# Patient Record
Sex: Female | Born: 1955 | Race: Black or African American | Hispanic: No | Marital: Married | Smoking: Former smoker
Health system: Southern US, Community
[De-identification: ages and names within clinical notes are randomized; demographics above are authoritative.]

## PROBLEM LIST (undated history)

## (undated) DIAGNOSIS — I10 Essential (primary) hypertension: Secondary | ICD-10-CM

## (undated) DIAGNOSIS — E079 Disorder of thyroid, unspecified: Secondary | ICD-10-CM

## (undated) DIAGNOSIS — G4730 Sleep apnea, unspecified: Secondary | ICD-10-CM

## (undated) DIAGNOSIS — C801 Malignant (primary) neoplasm, unspecified: Secondary | ICD-10-CM

## (undated) HISTORY — DX: Disorder of thyroid, unspecified: E07.9

## (undated) HISTORY — PX: THYROID SURGERY: SHX805

## (undated) HISTORY — PX: ABDOMINAL HYSTERECTOMY: SHX81

## (undated) HISTORY — DX: Sleep apnea, unspecified: G47.30

## (undated) HISTORY — PX: BLADDER SURGERY: SHX569

## (undated) HISTORY — PX: COLON SURGERY: SHX602

---

## 2006-07-31 DIAGNOSIS — C801 Malignant (primary) neoplasm, unspecified: Secondary | ICD-10-CM

## 2006-07-31 HISTORY — DX: Malignant (primary) neoplasm, unspecified: C80.1

## 2006-11-01 ENCOUNTER — Ambulatory Visit (HOSPITAL_COMMUNITY): Admission: RE | Admit: 2006-11-01 | Discharge: 2006-11-01 | Payer: Self-pay | Admitting: Surgery

## 2006-12-04 ENCOUNTER — Ambulatory Visit: Payer: Self-pay | Admitting: Oncology

## 2006-12-20 LAB — COMPREHENSIVE METABOLIC PANEL
ALT: 12 U/L (ref 0–35)
AST: 14 U/L (ref 0–37)
Albumin: 4.5 g/dL (ref 3.5–5.2)
Alkaline Phosphatase: 63 U/L (ref 39–117)
BUN: 14 mg/dL (ref 6–23)
CO2: 26 mEq/L (ref 19–32)
Chloride: 105 mEq/L (ref 96–112)
Creatinine, Ser: 0.82 mg/dL (ref 0.40–1.20)
Potassium: 4.1 mEq/L (ref 3.5–5.3)
Total Bilirubin: 0.2 mg/dL — ABNORMAL LOW (ref 0.3–1.2)
Total Protein: 7.3 g/dL (ref 6.0–8.3)

## 2006-12-20 LAB — CBC WITH DIFFERENTIAL/PLATELET
Basophils Absolute: 0 10*3/uL (ref 0.0–0.1)
EOS%: 4.4 % (ref 0.0–7.0)
Eosinophils Absolute: 0.2 10*3/uL (ref 0.0–0.5)
HGB: 10.6 g/dL — ABNORMAL LOW (ref 11.6–15.9)
MCH: 26.8 pg (ref 26.0–34.0)
MCHC: 32.7 g/dL (ref 32.0–36.0)
MCV: 81.9 fL (ref 81.0–101.0)
MONO#: 0.4 10*3/uL (ref 0.1–0.9)
MONO%: 7.5 % (ref 0.0–13.0)
NEUT%: 55.7 % (ref 39.6–76.8)
Platelets: 249 10*3/uL (ref 145–400)
RBC: 3.95 10*6/uL (ref 3.70–5.32)
RDW: 14.7 % — ABNORMAL HIGH (ref 11.3–14.5)
lymph#: 1.7 10*3/uL (ref 0.9–3.3)

## 2006-12-26 ENCOUNTER — Ambulatory Visit (HOSPITAL_COMMUNITY): Admission: RE | Admit: 2006-12-26 | Discharge: 2006-12-26 | Payer: Self-pay | Admitting: Oncology

## 2007-01-15 LAB — CBC WITH DIFFERENTIAL/PLATELET
EOS%: 3 % (ref 0.0–7.0)
LYMPH%: 38 % (ref 14.0–48.0)
MONO#: 0.6 10*3/uL (ref 0.1–0.9)
Platelets: 204 10*3/uL (ref 145–400)
RBC: 4.34 10*6/uL (ref 3.70–5.32)
RDW: 15.5 % — ABNORMAL HIGH (ref 11.3–14.5)

## 2007-01-19 ENCOUNTER — Ambulatory Visit: Payer: Self-pay | Admitting: Oncology

## 2007-01-30 LAB — CBC WITH DIFFERENTIAL/PLATELET
BASO%: 1.2 % (ref 0.0–2.0)
Basophils Absolute: 0.1 10*3/uL (ref 0.0–0.1)
HCT: 38.3 % (ref 34.8–46.6)
LYMPH%: 34.8 % (ref 14.0–48.0)
MCH: 28.2 pg (ref 26.0–34.0)
MCV: 84.3 fL (ref 81.0–101.0)
NEUT#: 2.9 10*3/uL (ref 1.5–6.5)
NEUT%: 53 % (ref 39.6–76.8)
Platelets: 150 10*3/uL (ref 145–400)
WBC: 5.4 10*3/uL (ref 3.9–10.0)

## 2007-02-14 LAB — CBC WITH DIFFERENTIAL/PLATELET
BASO%: 2 % (ref 0.0–2.0)
EOS%: 1.3 % (ref 0.0–7.0)
HCT: 41.2 % (ref 34.8–46.6)
HGB: 13.2 g/dL (ref 11.6–15.9)
MONO#: 0.6 10*3/uL (ref 0.1–0.9)
Platelets: 113 10*3/uL — ABNORMAL LOW (ref 145–400)
RDW: 18.9 % — ABNORMAL HIGH (ref 11.3–14.5)
WBC: 4.8 10*3/uL (ref 3.9–10.0)
lymph#: 2 10*3/uL (ref 0.9–3.3)

## 2007-02-14 LAB — COMPREHENSIVE METABOLIC PANEL
ALT: 17 U/L (ref 0–35)
CO2: 24 mEq/L (ref 19–32)
Chloride: 106 mEq/L (ref 96–112)
Creatinine, Ser: 0.75 mg/dL (ref 0.40–1.20)
Sodium: 140 mEq/L (ref 135–145)
Total Protein: 7 g/dL (ref 6.0–8.3)

## 2007-02-26 LAB — CBC WITH DIFFERENTIAL/PLATELET
Basophils Absolute: 0.1 10*3/uL (ref 0.0–0.1)
EOS%: 1.2 % (ref 0.0–7.0)
Eosinophils Absolute: 0.1 10*3/uL (ref 0.0–0.5)
HCT: 41.3 % (ref 34.8–46.6)
LYMPH%: 37.1 % (ref 14.0–48.0)
MCH: 28.7 pg (ref 26.0–34.0)
MONO#: 0.7 10*3/uL (ref 0.1–0.9)
NEUT#: 2.4 10*3/uL (ref 1.5–6.5)
NEUT%: 46.7 % (ref 39.6–76.8)
Platelets: 108 10*3/uL — ABNORMAL LOW (ref 145–400)
RBC: 4.65 10*6/uL (ref 3.70–5.32)
RDW: 19.9 % — ABNORMAL HIGH (ref 11.3–14.5)

## 2007-02-28 ENCOUNTER — Ambulatory Visit: Payer: Self-pay | Admitting: Oncology

## 2007-03-14 LAB — CBC WITH DIFFERENTIAL/PLATELET
Basophils Absolute: 0.1 10*3/uL (ref 0.0–0.1)
LYMPH%: 41 % (ref 14.0–48.0)
MCH: 29.2 pg (ref 26.0–34.0)
MCHC: 33 g/dL (ref 32.0–36.0)
NEUT#: 2 10*3/uL (ref 1.5–6.5)
NEUT%: 44.7 % (ref 39.6–76.8)
RBC: 4.41 10*6/uL (ref 3.70–5.32)
lymph#: 1.8 10*3/uL (ref 0.9–3.3)

## 2007-03-28 LAB — CBC WITH DIFFERENTIAL/PLATELET
EOS%: 1 % (ref 0.0–7.0)
Eosinophils Absolute: 0 10*3/uL (ref 0.0–0.5)
LYMPH%: 41.6 % (ref 14.0–48.0)
MCH: 29.8 pg (ref 26.0–34.0)
MCHC: 32.5 g/dL (ref 32.0–36.0)
MCV: 91.8 fL (ref 81.0–101.0)
MONO%: 11.7 % (ref 0.0–13.0)
Platelets: 70 10*3/uL — ABNORMAL LOW (ref 145–400)
RBC: 4.43 10*6/uL (ref 3.70–5.32)

## 2007-03-28 LAB — COMPREHENSIVE METABOLIC PANEL
AST: 26 U/L (ref 0–37)
Albumin: 4.2 g/dL (ref 3.5–5.2)
Chloride: 101 mEq/L (ref 96–112)
Glucose, Bld: 260 mg/dL — ABNORMAL HIGH (ref 70–99)
Total Bilirubin: 0.3 mg/dL (ref 0.3–1.2)
Total Protein: 6.8 g/dL (ref 6.0–8.3)

## 2007-04-09 ENCOUNTER — Ambulatory Visit: Payer: Self-pay | Admitting: Oncology

## 2007-04-10 LAB — CBC WITH DIFFERENTIAL/PLATELET
BASO%: 1.8 % (ref 0.0–2.0)
LYMPH%: 37.6 % (ref 14.0–48.0)
MCHC: 32.5 g/dL (ref 32.0–36.0)
MONO#: 0.8 10*3/uL (ref 0.1–0.9)
MONO%: 13.5 % — ABNORMAL HIGH (ref 0.0–13.0)
Platelets: 109 10*3/uL — ABNORMAL LOW (ref 145–400)
RBC: 4.43 10*6/uL (ref 3.70–5.32)
RDW: 18.4 % — ABNORMAL HIGH (ref 11.3–14.5)
WBC: 5.7 10*3/uL (ref 3.9–10.0)

## 2007-04-25 LAB — COMPREHENSIVE METABOLIC PANEL
ALT: 18 U/L (ref 0–35)
Alkaline Phosphatase: 69 U/L (ref 39–117)
CO2: 25 mEq/L (ref 19–32)
Sodium: 140 mEq/L (ref 135–145)
Total Bilirubin: 0.3 mg/dL (ref 0.3–1.2)
Total Protein: 7.2 g/dL (ref 6.0–8.3)

## 2007-04-25 LAB — CBC WITH DIFFERENTIAL/PLATELET
BASO%: 1 % (ref 0.0–2.0)
LYMPH%: 31.6 % (ref 14.0–48.0)
MCHC: 34.3 g/dL (ref 32.0–36.0)
MCV: 95.5 fL (ref 81.0–101.0)
MONO%: 10.5 % (ref 0.0–13.0)
Platelets: 132 10*3/uL — ABNORMAL LOW (ref 145–400)
RBC: 3.97 10*6/uL (ref 3.70–5.32)

## 2007-05-09 LAB — CBC WITH DIFFERENTIAL/PLATELET
BASO%: 2 % (ref 0.0–2.0)
LYMPH%: 35.5 % (ref 14.0–48.0)
MCHC: 35.1 g/dL (ref 32.0–36.0)
MONO#: 0.6 10*3/uL (ref 0.1–0.9)
RBC: 3.71 10*6/uL (ref 3.70–5.32)
WBC: 3.9 10*3/uL (ref 3.9–10.0)
lymph#: 1.4 10*3/uL (ref 0.9–3.3)

## 2007-05-20 LAB — CBC WITH DIFFERENTIAL/PLATELET
BASO%: 1.3 % (ref 0.0–2.0)
HCT: 39.6 % (ref 34.8–46.6)
MCHC: 33.4 g/dL (ref 32.0–36.0)
MCV: 101 fL (ref 81.0–101.0)
MONO#: 0.7 10*3/uL (ref 0.1–0.9)
NEUT%: 45.3 % (ref 39.6–76.8)
RBC: 3.92 10*6/uL (ref 3.70–5.32)
RDW: 14.4 % (ref 11.3–14.5)
WBC: 5.3 10*3/uL (ref 3.9–10.0)
lymph#: 2 10*3/uL (ref 0.9–3.3)

## 2007-05-21 ENCOUNTER — Ambulatory Visit: Payer: Self-pay | Admitting: Oncology

## 2007-05-23 LAB — COMPREHENSIVE METABOLIC PANEL
Alkaline Phosphatase: 67 U/L (ref 39–117)
CO2: 23 mEq/L (ref 19–32)
Creatinine, Ser: 0.93 mg/dL (ref 0.40–1.20)
Glucose, Bld: 172 mg/dL — ABNORMAL HIGH (ref 70–99)
Sodium: 141 mEq/L (ref 135–145)
Total Bilirubin: 0.4 mg/dL (ref 0.3–1.2)
Total Protein: 7.3 g/dL (ref 6.0–8.3)

## 2007-06-06 LAB — CBC WITH DIFFERENTIAL/PLATELET
Basophils Absolute: 0 10*3/uL (ref 0.0–0.1)
Eosinophils Absolute: 0.1 10*3/uL (ref 0.0–0.5)
HCT: 39.2 % (ref 34.8–46.6)
HGB: 13.7 g/dL (ref 11.6–15.9)
LYMPH%: 28.2 % (ref 14.0–48.0)
MCV: 97.2 fL (ref 81.0–101.0)
MONO#: 0.4 10*3/uL (ref 0.1–0.9)
MONO%: 7.8 % (ref 0.0–13.0)
NEUT#: 2.9 10*3/uL (ref 1.5–6.5)
NEUT%: 61.3 % (ref 39.6–76.8)
Platelets: 110 10*3/uL — ABNORMAL LOW (ref 145–400)
WBC: 4.7 10*3/uL (ref 3.9–10.0)

## 2007-07-02 ENCOUNTER — Ambulatory Visit: Payer: Self-pay | Admitting: Oncology

## 2007-07-02 LAB — CBC WITH DIFFERENTIAL/PLATELET
BASO%: 1.5 % (ref 0.0–2.0)
HCT: 43.8 % (ref 34.8–46.6)
LYMPH%: 42.8 % (ref 14.0–48.0)
MCH: 32.8 pg (ref 26.0–34.0)
MCHC: 34.3 g/dL (ref 32.0–36.0)
MCV: 95.8 fL (ref 81.0–101.0)
MONO#: 0.7 10*3/uL (ref 0.1–0.9)
MONO%: 13.6 % — ABNORMAL HIGH (ref 0.0–13.0)
NEUT%: 40.1 % (ref 39.6–76.8)
Platelets: 147 10*3/uL (ref 145–400)
RBC: 4.57 10*6/uL (ref 3.70–5.32)
WBC: 5.5 10*3/uL (ref 3.9–10.0)

## 2007-07-02 LAB — COMPREHENSIVE METABOLIC PANEL
ALT: 19 U/L (ref 0–35)
Alkaline Phosphatase: 80 U/L (ref 39–117)
CO2: 24 mEq/L (ref 19–32)
Creatinine, Ser: 0.82 mg/dL (ref 0.40–1.20)
Glucose, Bld: 128 mg/dL — ABNORMAL HIGH (ref 70–99)
Total Bilirubin: 0.4 mg/dL (ref 0.3–1.2)

## 2007-07-02 LAB — CEA: CEA: 0.8 ng/mL (ref 0.0–5.0)

## 2007-08-09 LAB — CBC WITH DIFFERENTIAL/PLATELET
Basophils Absolute: 0 10*3/uL (ref 0.0–0.1)
Eosinophils Absolute: 0.1 10*3/uL (ref 0.0–0.5)
HGB: 13.6 g/dL (ref 11.6–15.9)
LYMPH%: 37.9 % (ref 14.0–48.0)
MCV: 96.1 fL (ref 81.0–101.0)
MONO%: 9.5 % (ref 0.0–13.0)
NEUT#: 2.6 10*3/uL (ref 1.5–6.5)
Platelets: 221 10*3/uL (ref 145–400)

## 2007-08-09 LAB — COMPREHENSIVE METABOLIC PANEL
Alkaline Phosphatase: 80 U/L (ref 39–117)
BUN: 12 mg/dL (ref 6–23)
Glucose, Bld: 166 mg/dL — ABNORMAL HIGH (ref 70–99)
Total Bilirubin: 0.5 mg/dL (ref 0.3–1.2)

## 2007-10-04 ENCOUNTER — Ambulatory Visit: Payer: Self-pay | Admitting: Oncology

## 2007-11-26 ENCOUNTER — Ambulatory Visit: Payer: Self-pay | Admitting: Oncology

## 2007-11-29 LAB — CBC WITH DIFFERENTIAL/PLATELET
Eosinophils Absolute: 0.2 10*3/uL (ref 0.0–0.5)
HCT: 40.5 % (ref 34.8–46.6)
HGB: 13.1 g/dL (ref 11.6–15.9)
MCH: 30 pg (ref 26.0–34.0)
MCV: 92.5 fL (ref 81.0–101.0)
NEUT#: 2.3 10*3/uL (ref 1.5–6.5)
RBC: 4.38 10*6/uL (ref 3.70–5.32)
RDW: 12.9 % (ref 11.3–14.5)
lymph#: 1.7 10*3/uL (ref 0.9–3.3)

## 2007-11-29 LAB — COMPREHENSIVE METABOLIC PANEL
ALT: 16 U/L (ref 0–35)
AST: 18 U/L (ref 0–37)
Albumin: 4.3 g/dL (ref 3.5–5.2)
Alkaline Phosphatase: 82 U/L (ref 39–117)
BUN: 13 mg/dL (ref 6–23)
CO2: 24 mEq/L (ref 19–32)
Calcium: 8.9 mg/dL (ref 8.4–10.5)
Chloride: 106 mEq/L (ref 96–112)
Creatinine, Ser: 0.81 mg/dL (ref 0.40–1.20)
Glucose, Bld: 264 mg/dL — ABNORMAL HIGH (ref 70–99)
Potassium: 3.9 mEq/L (ref 3.5–5.3)
Sodium: 141 mEq/L (ref 135–145)
Total Bilirubin: 0.3 mg/dL (ref 0.3–1.2)
Total Protein: 7 g/dL (ref 6.0–8.3)

## 2008-03-10 ENCOUNTER — Ambulatory Visit: Payer: Self-pay | Admitting: Oncology

## 2009-05-17 ENCOUNTER — Emergency Department (HOSPITAL_COMMUNITY): Admission: EM | Admit: 2009-05-17 | Discharge: 2009-05-18 | Payer: Self-pay | Admitting: Emergency Medicine

## 2010-11-03 LAB — DIFFERENTIAL
Basophils Relative: 1 % (ref 0–1)
Eosinophils Absolute: 0.2 10*3/uL (ref 0.0–0.7)
Lymphocytes Relative: 44 % (ref 12–46)
Monocytes Absolute: 0.5 10*3/uL (ref 0.1–1.0)
Monocytes Relative: 8 % (ref 3–12)
Neutro Abs: 2.7 10*3/uL (ref 1.7–7.7)

## 2010-11-03 LAB — BASIC METABOLIC PANEL
CO2: 29 mEq/L (ref 19–32)
Calcium: 9.9 mg/dL (ref 8.4–10.5)
Chloride: 98 mEq/L (ref 96–112)
Creatinine, Ser: 1 mg/dL (ref 0.4–1.2)
GFR calc Af Amer: >60 mL/min (ref >60)
GFR calc non Af Amer: 58 mL/min — ABNORMAL LOW (ref >60)

## 2010-11-03 LAB — POCT CARDIAC MARKERS
CKMB, poc: 1.8 ng/mL (ref 1.0–8.0)
CKMB, poc: <1 ng/mL — ABNORMAL LOW (ref 1.0–8.0)
Myoglobin, poc: 66.9 ng/mL (ref 12–200)
Myoglobin, poc: 81.3 ng/mL (ref 12–200)
Troponin i, poc: <0.05 ng/mL (ref 0.00–0.09)
Troponin i, poc: <0.05 ng/mL (ref 0.00–0.09)

## 2010-11-03 LAB — GLUCOSE, CAPILLARY

## 2010-11-03 LAB — CBC
HCT: 39.6 % (ref 36.0–46.0)
Platelets: 181 10*3/uL (ref 150–400)
RBC: 4.18 MIL/uL (ref 3.87–5.11)
RDW: 12.9 % (ref 11.5–15.5)
WBC: 6 10*3/uL (ref 4.0–10.5)

## 2010-12-19 ENCOUNTER — Emergency Department (HOSPITAL_COMMUNITY)
Admission: EM | Admit: 2010-12-19 | Discharge: 2010-12-19 | Discharge status: Against Medical Advice | Payer: Self-pay | Attending: Emergency Medicine | Admitting: Emergency Medicine

## 2010-12-19 DIAGNOSIS — R209 Unspecified disturbances of skin sensation: Secondary | ICD-10-CM | POA: Insufficient documentation

## 2010-12-19 DIAGNOSIS — M542 Cervicalgia: Secondary | ICD-10-CM | POA: Insufficient documentation

## 2010-12-19 DIAGNOSIS — M79609 Pain in unspecified limb: Secondary | ICD-10-CM | POA: Insufficient documentation

## 2010-12-19 DIAGNOSIS — M25519 Pain in unspecified shoulder: Secondary | ICD-10-CM | POA: Insufficient documentation

## 2011-11-17 ENCOUNTER — Emergency Department (HOSPITAL_COMMUNITY): Payer: Non-veteran care

## 2011-11-17 ENCOUNTER — Emergency Department (HOSPITAL_COMMUNITY)
Admission: EM | Admit: 2011-11-17 | Discharge: 2011-11-17 | Disposition: A | Discharge status: Home / Self Care | Payer: Non-veteran care | Attending: Emergency Medicine | Admitting: Emergency Medicine

## 2011-11-17 ENCOUNTER — Encounter (HOSPITAL_COMMUNITY): Payer: Self-pay | Admitting: Emergency Medicine

## 2011-11-17 DIAGNOSIS — R079 Chest pain, unspecified: Secondary | ICD-10-CM | POA: Insufficient documentation

## 2011-11-17 DIAGNOSIS — M549 Dorsalgia, unspecified: Secondary | ICD-10-CM

## 2011-11-17 DIAGNOSIS — M62838 Other muscle spasm: Secondary | ICD-10-CM | POA: Insufficient documentation

## 2011-11-17 HISTORY — DX: Malignant (primary) neoplasm, unspecified: C80.1

## 2011-11-17 HISTORY — DX: Essential (primary) hypertension: I10

## 2011-11-17 LAB — CBC
HCT: 40.9 % (ref 36.0–46.0)
Hemoglobin: 12.9 g/dL (ref 12.0–15.0)
MCHC: 31.5 g/dL (ref 30.0–36.0)
MCV: 94.2 fL (ref 78.0–100.0)

## 2011-11-17 LAB — POCT I-STAT, CHEM 8
Creatinine, Ser: 1 mg/dL (ref 0.50–1.10)
HCT: 41 % (ref 36.0–46.0)
Hemoglobin: 13.9 g/dL (ref 12.0–15.0)
Potassium: 4 mEq/L (ref 3.5–5.1)
Sodium: 140 mEq/L (ref 135–145)

## 2011-11-17 LAB — DIFFERENTIAL
Basophils Relative: 1 % (ref 0–1)
Monocytes Absolute: 0.5 10*3/uL (ref 0.1–1.0)
Monocytes Relative: 9 % (ref 3–12)
Neutro Abs: 2.4 10*3/uL (ref 1.7–7.7)

## 2011-11-17 LAB — PROTIME-INR: INR: 1.01 (ref 0.00–1.49)

## 2011-11-17 LAB — POCT I-STAT TROPONIN I

## 2011-11-17 LAB — D-DIMER, QUANTITATIVE: D-Dimer, Quant: 0.26 ug/mL-FEU (ref 0.00–0.48)

## 2011-11-17 MED ORDER — KETOROLAC TROMETHAMINE 30 MG/ML IJ SOLN: 30.0000 mg | Freq: Once | INTRAMUSCULAR | Status: DC

## 2011-11-17 MED ORDER — NAPROXEN 500 MG PO TABS: 500.0000 mg | ORAL_TABLET | Freq: Two times a day (BID) | ORAL | Status: AC

## 2011-11-17 MED ORDER — SODIUM CHLORIDE 0.9 % IV SOLN: Freq: Once | INTRAVENOUS | Status: DC

## 2011-11-17 MED ORDER — METHOCARBAMOL 500 MG PO TABS: 500.0000 mg | ORAL_TABLET | Freq: Two times a day (BID) | ORAL | Status: AC | PRN

## 2011-11-17 MED ORDER — KETOROLAC TROMETHAMINE 60 MG/2ML IM SOLN
60.0000 mg | Freq: Once | INTRAMUSCULAR | Status: AC
  Administered 2011-11-17: 60 mg via INTRAMUSCULAR
  Filled 2011-11-17: qty 2

## 2011-11-17 NOTE — Discharge Instructions (Signed)
Your caregiver has diagnosed you as having chest pain that is nonspecific for one problem. This means that after looking at you and examining you and ordering tests (such as blood work, chest x-rays and EKG), your caregiver does not believe that the problem is serious enough to need watching in the hospital. This judgment is often made after testing shows no acute heart attack and you are at low risk for sudden acute heart condition. Chest pain comes from many different causes.  Seek immediate medical attention if:   You have severe chest pain, especially if the pain is crushing or pressure-like and spreads to the arms, back, neck, or jaw, or if you have sweating, nausea, shortness of breath. This is an emergency. Don't wait to see if the pain will go away. Get medical help at once. Call 911 immediately. Do not drive herself to the hospital.   Your chest pain gets worse and does not go away with rest.   You have an attack of chest pain lasting longer than usual, despite rest and treatment with the medications your caregiver has prescribed   You awaken from sleep with chest pain or shortness of breath.   You feel faint or dizzy   You have chest pain not typical of your usual pain for which you originally saw your caregiver.  You must have a repeat evaluation within 24 hours for a recheck of your heart.  Please call your doctor this morning to schedule this appointment. If you do not have a family doctor, please see the list of doctors below.  RESOURCE GUIDE  Dental Problems  Patients with Medicaid: [facility]                     [facility] [address]                                           [address] Phone:  [phone]                                                  Phone:  [phone]  If unable to pay or uninsured, contact:  [facility] or [facility] to become qualified for the adult dental clinic.  Chronic Pain  Problems Contact [facility]  [phone] Patients need to be referred by their primary care doctor.  Insufficient Money for Medicine Contact [employer]:  call "211" or [facility] 271-5999.  No Primary Care Doctor Call [facility]  832-8000 Other agencies that provide inexpensive medical care    [facility]  [phone]    [facility]  [phone]    [facility]  [phone]    [facility] Clinic  [phone]    [facility]  [phone]    [facility]  [phone]  Psychological Services [facility]  [phone] [facility]  [phone] [facility]   800 853-5163 (emergency services 641-4993)  Substance Abuse Resources [facility]  336-882-2125 [facility] 336-784-9470 [facility] 336-285-9073 [facility] 336-845-3988 Residential & Outpatient Substance Abuse Program  800-659-3381  Abuse/Neglect [place] Child Abuse Hotline (336) 641-3795 [place] [facility] Hotline 800-378-5315 (After Hours)  Emergency Shelter [facility] (336) 271-5985  [facility] (336) 275-9566 [facility] (704) 372-4663  MRSA Hotline #:   832-7006    [place] [facility]  [facility] [place]     [employer]                          [facility] [address] [place]                       [address]      [address]  [place]                                                [place]                            [place] Phone:  [phone]                                   Phone:  [phone]                 Phone:  [phone]  [facility] Phone:  [phone]  [place] Child Abuse Hotline (336) 342-1394 (336) 342-3537 (After Hours)    

## 2011-11-17 NOTE — ED Notes (Signed)
Monitor shows NSR to sinus brady - no ectopics.  Skin warm and dry - color good.  02 started via nasal cannula at 2L/min

## 2011-11-17 NOTE — ED Provider Notes (Signed)
History     CSN: [id number]  Arrival date & time 11/17/11  0211   First MD Initiated Contact with Patient 11/17/11 [phone]      Chief Complaint  Patient presents with  . Chest Pain    (Consider location/radiation/quality/duration/timing/severity/associated sxs/prior treatment) HPI Comments: 56 year old female with a past medical history significant for hypertension and diabetes and colon cancer for which she had surgery in 2008 and finished chemotherapy. She has been told that she is in remission. Approximately 36 hours ago she developed a right upper back pain which was sharp and stinging, worse with movement and palpation and deep breathing. Over the last 24 hours this has become gradually worse, while she was in bed tonight she rolled over and felt that the pain became acutely worse. This occurred during this motion. She states that the pain is now in her chest is a sharp pain and is also reproducible with palpation over her right parasternal area. The symptoms are persistent, worse with deep breathing, not associated with cough, shortness of breath, swelling of the legs, asymmetry, travel, trauma, hormone replacement, tobacco use.  She states that her disease does run in her family however she has had EKGs but never had a stress test. She states that this pain has been more or less constant for the last day and a half.  The history is provided by the patient and medical records.    History reviewed. No pertinent past medical history.  No past surgical history on file.  No family history on file.  History  Substance Use Topics  . Smoking status: Not on file  . Smokeless tobacco: Not on file  . Alcohol Use: Not on file    OB History    Grav Para Term Preterm Abortions TAB SAB Ect Mult Living                  Review of Systems  All other systems reviewed and are negative.    Allergies  Review of patient's allergies indicates no known allergies.  Home Medications    Current Outpatient Rx  Name Route Sig Dispense Refill  . CLONIDINE HCL 0.1 MG PO TABS Oral Take 0.1 mg by mouth 2 (two) times daily. Does not know the DOSE    . LEVOTHYROXINE SODIUM 25 MCG PO TABS Oral Take by mouth daily. NOT SURE OF THE DOSAGE    . LISINOPRIL 10 MG PO TABS Oral Take 10 mg by mouth daily. Does not know the DOSE    . METFORMIN HCL 500 MG PO TABS Oral Take 500 mg by mouth 2 (two) times daily with a meal. Dose not know the DOSE      BP 153/72  Pulse 72  Temp(Src) 98.1 F (36.7 C) (Oral)  Resp 16  SpO2 98%  Physical Exam  Nursing note and vitals reviewed. Constitutional: She appears well-developed and well-nourished. No distress.  HENT:  Head: Normocephalic and atraumatic.  Mouth/Throat: Oropharynx is clear and moist. No oropharyngeal exudate.  Eyes: Conjunctivae and EOM are normal. Pupils are equal, round, and reactive to light. Right eye exhibits no discharge. Left eye exhibits no discharge. No scleral icterus.  Neck: Normal range of motion. Neck supple. No JVD present. No thyromegaly present.  Cardiovascular: Normal rate, regular rhythm, normal heart sounds and intact distal pulses.  Exam reveals no gallop and no friction rub.   No murmur heard. Pulmonary/Chest: Effort normal and breath sounds normal. No respiratory distress. She has no wheezes. She has no  rales. She exhibits tenderness ( right parasternal reproducible chest wall tenderness, patient states this is exactly the same as her pain).  Abdominal: Soft. Bowel sounds are normal. She exhibits no distension and no mass. There is no tenderness.  Musculoskeletal: Normal range of motion. She exhibits tenderness. She exhibits no edema.       Tender palpation over the right rhomboid area, worse with stretching the same muscles with extension at the right elbow and bringing the arm across the body.  Lymphadenopathy:    She has no cervical adenopathy.  Neurological: She is alert. Coordination normal.  Skin: Skin is  warm and dry. No rash noted. No erythema.  Psychiatric: She has a normal mood and affect. Her behavior is normal.    ED Course  Procedures (including critical care time)  ED ECG REPORT   Date: 11/17/2011   Rate: 70  Rhythm: normal sinus rhythm  QRS Axis: normal  Intervals: normal  ST/T Wave abnormalities: normal  Conduction Disutrbances:none  Narrative Interpretation:   Old EKG Reviewed: compared with 12/19/2010, and no changes found   Labs Reviewed - No data to display No results found.   No diagnosis found.    MDM  The heart and lungs sound normal, she does not have a tachycardia or hypoxia or any evidence of DVT on her lower extremity exam. Her risk factors for pulmonary embolism include history of colon cancer though this is not active. Her EKG is normal, there is no signs of ischemia, tachycardia or right heart strain. We'll pursue laboratory workup, chest x-ray, Toradol, reevaluate.  ED Course:  The patient has improved after medications, vital signs remain in a normal range and the pain has been going on greater than 24 hours and is reproducible to palpation. I suspect that given her EKG which appears nonischemic, laboratory values which show no elevated d-dimer or troponin but this is more of a musculoskeletal type pain. I have encouraged her to followup with her family Dr. For further testing if her symptoms should continue or return to the hospital should they worsen.  The patient has been informed of her results of both laboratory and imaging. Symptoms improved after medications, she is amenable to discharge. I have encouraged her to followup outpatient for stress test as needed   Labarotory results reviewed:  Normal troponin, normal d-dimer, normal coags, normal CBC without leukocytosis or anemia or thrombocytopenia. Normal renal function, slightly elevated glucose at 156   Radiology imaging reviewed:  Chest x-ray negative for any acute infiltrates, pneumothorax or  soft tissue or mediastinal abnormalities. I personally reviewed these x-rays and agree with the radiologist read.   Previous Records reviewed: noncontributory   Medications given in ED: intramuscular Toradol  The pt and (or) family members were informed of results and need for close follow up  Consultation: outpatient followup  Disposition:  Home  Discharge Prescriptions include:  Naprosyn Robaxin        Vida Roller, MD 11/17/11 [phone]

## 2011-11-17 NOTE — ED Notes (Signed)
Patient refuses ANY IV - states they hurt too bad and she can't take it.  No attempts at IV.  Labs drawn by phlebotomist

## 2012-07-31 HISTORY — PX: ESOPHAGOGASTRODUODENOSCOPY: SHX1529

## 2015-08-01 HISTORY — PX: COLONOSCOPY: SHX174

## 2017-08-16 ENCOUNTER — Encounter: Payer: Self-pay | Admitting: Surgery

## 2017-08-16 ENCOUNTER — Ambulatory Visit (INDEPENDENT_AMBULATORY_CARE_PROVIDER_SITE_OTHER): Payer: Non-veteran care | Admitting: Surgery

## 2017-08-16 VITALS — BP 133/76 | HR 98 | Temp 97.8°F | Wt 158.6 lb

## 2017-08-16 DIAGNOSIS — R1010 Upper abdominal pain, unspecified: Secondary | ICD-10-CM | POA: Diagnosis not present

## 2017-08-17 ENCOUNTER — Encounter: Payer: Self-pay | Admitting: Surgery

## 2017-08-17 NOTE — Progress Notes (Signed)
Patient ID: Bentli Llorente, female   DOB: Dec 30, 1955, 62 y.o.   MRN: [id number]  HPI Kyah Buesing is a 62 y.o. female   HPI  62 year old female asked to be evaluated by the [facility] system. I have personally reviewed over 14 pages Sam by the [place] system. Automatic a half ago the patient went to term [place] for right upper quadrant abdominal pain. She reported that the pain was intermittent and moderate in intensity. The emergency tonight at that time an ultrasound was performed showing some evidence of gallbladder wall thickening. No evidence of gallstones or cholecystitis. Normal common bile duct CBD 5.5 mm. She also had a CT scan and the report stated some mildly dilated intra-and extrahepatic ducts, CBD 8 mm. He also states that she went to the [place] wrap height that was performed. Unfortunately do not have any records of that HIDA scan. She did have LFTs that were completely normal. More importantly she reports that she has not had any abdominal pain for a few weeks now. She feels well and is able to eat without having any abdominal discomfort. No nausea vomiting no fevers or chills.   Past Medical History:  Diagnosis Date  . Cancer ([place])   . Diabetes mellitus   . Hypertension     Past Surgical History:  Procedure Laterality Date  . ABDOMINAL HYSTERECTOMY     Partial*  . BLADDER SURGERY    . COLON SURGERY    . THYROID SURGERY      Family History  Problem Relation Age of Onset  . Hypertension Daughter     Social History Social History   Tobacco Use  . Smoking status: Not on file  Substance Use Topics  . Alcohol use: No  . Drug use: Not on file    No Known Allergies  Current Outpatient Medications  Medication Sig Dispense Refill  . cloNIDine (CATAPRES) 0.1 MG tablet Take 0.1 mg by mouth 2 (two) times daily. Does not know the DOSE    . levothyroxine (SYNTHROID, LEVOTHROID) 25 MCG tablet Take by mouth daily. NOT SURE OF THE DOSAGE    . lisinopril (PRINIVIL,ZESTRIL) 10 MG  tablet Take 10 mg by mouth daily. Does not know the DOSE    . metFORMIN (GLUCOPHAGE) 500 MG tablet Take 500 mg by mouth 2 (two) times daily with a meal. Dose not know the DOSE     No current facility-administered medications for this visit.      Review of Systems Full ROS  was asked and was negative except for the information on the HPI  Physical Exam Blood pressure 133/76, pulse 98, temperature 97.8 F (36.6 C), temperature source Oral, weight 71.9 kg (158 lb 9.6 oz). CONSTITUTIONAL: NAD EYES: Pupils are equal, round, and reactive to light, Sclera are non-icteric. EARS, NOSE, MOUTH AND THROAT: The oropharynx is clear. The oral mucosa is pink and moist. Hearing is intact to voice. LYMPH NODES:  Lymph nodes in the neck are normal. RESPIRATORY:  Lungs are clear. There is normal respiratory effort, with equal breath sounds bilaterally, and without pathologic use of accessory muscles. CARDIOVASCULAR: Heart is regular without murmurs, gallops, or rubs. GI: The abdomen is  soft, nontender, and nondistended. There are no palpable masses. There is no hepatosplenomegaly. There are normal bowel sounds in all quadrants. GU: Rectal deferred.   MUSCULOSKELETAL: Normal muscle strength and tone. No cyanosis or edema.   SKIN: Turgor is good and there are no pathologic skin lesions or ulcers. NEUROLOGIC: Motor and sensation  is grossly normal. Cranial nerves are grossly intact. PSYCH:  Oriented to person, place and time. Affect is normal.  Data Reviewed I have personally reviewed the patient's  medical records extensively.    Assessment/Plan 62 year old female with a history of abdominal pain. So far diagnostic studies have been equivocal for biliary disease.  There was some questionable prominence in the common bile duct by CT scan but this is discordant with the ultrasound report showing a normal common bile duct.. I do not have all the records and I will obtain the latest HIDA scan. I will follow  her clinically and see her in a few weeks with the results. At that time we'll determine if any further diagnostic tests are needed, she may need another ultrasound to follow up that questionable biliary dilation on CT scan. At this point time I'm not convinced that her clinical symptoms are consistent with biliary colic. Discussed with the patient in detail about my thought process, she seems to understand and she agrees with my plan. No need for urgent surgical interventions. A copy of this report will be sent to the [facility] system.  Caroleen Hamman, MD FACS General Surgeon 08/17/2017, 8:56 PM

## 2017-08-20 ENCOUNTER — Telehealth: Payer: Self-pay

## 2017-08-20 NOTE — Telephone Encounter (Signed)
[facility] records, notes, and results obtained and scanned into patient's chart. These can be found under [facility] in patient's chart.

## 2017-08-27 ENCOUNTER — Telehealth: Payer: Self-pay | Admitting: Surgery

## 2017-08-27 NOTE — Telephone Encounter (Signed)
Patient would like to check to check on records from [place]. She is waiting on results before scheduling her next appointment. Please advise

## 2017-08-27 NOTE — Telephone Encounter (Signed)
Call returned to patient at this time I verbalized to her that we have received her records this morning and will place images in her chart for review. I advised that I will give her a call back letting her know what Dr. Dahlia Byes suggest based off his review of her images. She verbalized understanding at this time.

## 2017-08-29 ENCOUNTER — Ambulatory Visit
Admission: RE | Admit: 2017-08-29 | Discharge: 2017-08-29 | Disposition: A | Discharge status: Home / Self Care | Payer: Self-pay | Source: Ambulatory Visit | Attending: Surgery | Admitting: Surgery

## 2017-08-29 ENCOUNTER — Other Ambulatory Visit: Payer: Self-pay | Admitting: Surgery

## 2017-08-29 DIAGNOSIS — K819 Cholecystitis, unspecified: Secondary | ICD-10-CM

## 2017-08-30 ENCOUNTER — Telehealth: Payer: Self-pay

## 2017-08-30 NOTE — Telephone Encounter (Signed)
Patient called stating that has not heard anything back from our office and wanted to know what her next suggested move should be. She stated that she wanted to speak with Dr. Dahlia Byes regarding his review on her images from the [place]. I advised patient that I would send a message and have him or myself contact her with his suggestions. Patient verbalized understanding.   Call made to patient at this time and I advised patient of Dr. Dahlia Byes suggestions. She stated that she did not understand why we did not have all the images from [place]. I advised her that we did receive her imaging from [place] but not [place]. She verbalized frustration and I advised her that I would give [facility] a call to check the status and contact her back.She verbalized understanding.  Call made to [facility] and I spoke with the someone in imaging and advised that I sent a request for records that have not been received. He stated that the HIDA Scan was completed in [place] and is unsure why I didn't receive it if I sent a request. I verified the fax number I have on had and he gave me the correct fax number.   Call made to patient at this time and I advise her that I spoke with someone at the [facility] and they advised me that it was sent to the wrong fax number. I advised her that I have faxed the request to the correct fax number and will call her once I receive her images. Patient verbalized understanding.

## 2017-09-17 ENCOUNTER — Encounter: Payer: Self-pay | Admitting: Surgery

## 2017-09-17 ENCOUNTER — Ambulatory Visit (INDEPENDENT_AMBULATORY_CARE_PROVIDER_SITE_OTHER): Payer: Non-veteran care | Admitting: Surgery

## 2017-09-17 VITALS — BP 149/81 | HR 87 | Temp 97.3°F | Ht 63.0 in | Wt 157.0 lb

## 2017-09-17 DIAGNOSIS — K8050 Calculus of bile duct without cholangitis or cholecystitis without obstruction: Secondary | ICD-10-CM

## 2017-09-17 NOTE — Progress Notes (Signed)
62 year old femaleA 61 year old female well. -known to our practice well known to practice.  She was referred from the [place] for possible biliary colic. I have personally reviewed her HIDA scan, ultrasound and CT scans.No evidence of GS, normal fx of the GB, no evidence of cholecystitis. CT scan unremarkable. Apparently at some point in time she was told that she had stones More importantly her sxs have subsided, no abd pain, no fevers, no N/V.   PE NAD, awake and alert Abd: soft, NT, Ext: no edema and well perfused  A/P No objective evidence of gallstones, nml function per HIDA. No need for surgical intervention. Sxs have resolved. IF sxs may persist may repeat U/S. She understands. She will also try to get additional records for previous U/S . I have spent > 25 min with > 50% spent in counseling and coordination of care.

## 2017-09-17 NOTE — Progress Notes (Signed)
Medical compre

## 2017-09-17 NOTE — Patient Instructions (Signed)
Please give us a call in case you have any questions or concerns.  

## 2018-02-04 ENCOUNTER — Other Ambulatory Visit: Payer: Self-pay | Admitting: Physician Assistant

## 2018-02-04 DIAGNOSIS — R1010 Upper abdominal pain, unspecified: Secondary | ICD-10-CM

## 2018-02-14 ENCOUNTER — Ambulatory Visit
Admission: RE | Admit: 2018-02-14 | Discharge: 2018-02-14 | Disposition: A | Discharge status: Home / Self Care | Payer: Non-veteran care | Source: Ambulatory Visit | Attending: Physician Assistant | Admitting: Physician Assistant

## 2018-02-14 DIAGNOSIS — R1010 Upper abdominal pain, unspecified: Secondary | ICD-10-CM

## 2018-05-21 ENCOUNTER — Emergency Department (HOSPITAL_COMMUNITY)
Admission: EM | Admit: 2018-05-21 | Discharge: 2018-05-21 | Disposition: A | Discharge status: Home / Self Care | Payer: Non-veteran care | Attending: Emergency Medicine | Admitting: Emergency Medicine

## 2018-05-21 ENCOUNTER — Emergency Department (HOSPITAL_COMMUNITY): Payer: Non-veteran care

## 2018-05-21 ENCOUNTER — Other Ambulatory Visit: Payer: Self-pay

## 2018-05-21 ENCOUNTER — Encounter (HOSPITAL_COMMUNITY): Payer: Self-pay | Admitting: Emergency Medicine

## 2018-05-21 DIAGNOSIS — Z85038 Personal history of other malignant neoplasm of large intestine: Secondary | ICD-10-CM | POA: Diagnosis not present

## 2018-05-21 DIAGNOSIS — R0600 Dyspnea, unspecified: Secondary | ICD-10-CM | POA: Insufficient documentation

## 2018-05-21 DIAGNOSIS — I10 Essential (primary) hypertension: Secondary | ICD-10-CM | POA: Diagnosis not present

## 2018-05-21 DIAGNOSIS — Z7984 Long term (current) use of oral hypoglycemic drugs: Secondary | ICD-10-CM | POA: Diagnosis not present

## 2018-05-21 DIAGNOSIS — Z79899 Other long term (current) drug therapy: Secondary | ICD-10-CM | POA: Diagnosis not present

## 2018-05-21 DIAGNOSIS — Z7982 Long term (current) use of aspirin: Secondary | ICD-10-CM | POA: Diagnosis not present

## 2018-05-21 DIAGNOSIS — Z87891 Personal history of nicotine dependence: Secondary | ICD-10-CM | POA: Diagnosis not present

## 2018-05-21 DIAGNOSIS — M5412 Radiculopathy, cervical region: Secondary | ICD-10-CM

## 2018-05-21 DIAGNOSIS — E119 Type 2 diabetes mellitus without complications: Secondary | ICD-10-CM | POA: Insufficient documentation

## 2018-05-21 DIAGNOSIS — M25511 Pain in right shoulder: Secondary | ICD-10-CM | POA: Diagnosis present

## 2018-05-21 DIAGNOSIS — M79651 Pain in right thigh: Secondary | ICD-10-CM | POA: Insufficient documentation

## 2018-05-21 LAB — CBC
HCT: 41.7 % (ref 36.0–46.0)
Hemoglobin: 12.6 g/dL (ref 12.0–15.0)
MCH: 28.5 pg (ref 26.0–34.0)
MCHC: 30.2 g/dL (ref 30.0–36.0)
MCV: 94.3 fL (ref 80.0–100.0)
Platelets: 213 10*3/uL (ref 150–400)
RBC: 4.42 MIL/uL (ref 3.87–5.11)
RDW: 11.9 % (ref 11.5–15.5)
WBC: 6.3 10*3/uL (ref 4.0–10.5)
nRBC: 0 % (ref 0.0–0.2)

## 2018-05-21 LAB — BASIC METABOLIC PANEL
ANION GAP: 7 (ref 5–15)
BUN: 16 mg/dL (ref 8–23)
CALCIUM: 9.6 mg/dL (ref 8.9–10.3)
CO2: 27 mmol/L (ref 22–32)
Chloride: 104 mmol/L (ref 98–111)
Creatinine, Ser: 0.88 mg/dL (ref 0.44–1.00)
GFR calc Af Amer: >60 mL/min (ref >60)
GFR calc non Af Amer: >60 mL/min (ref >60)
GLUCOSE: 306 mg/dL — AB (ref 70–99)
Potassium: 4.5 mmol/L (ref 3.5–5.1)
Sodium: 138 mmol/L (ref 135–145)

## 2018-05-21 LAB — D-DIMER, QUANTITATIVE: D-Dimer, Quant: 0.31 ug/mL-FEU (ref 0.00–0.50)

## 2018-05-21 LAB — I-STAT TROPONIN, ED: TROPONIN I, POC: 0.01 ng/mL (ref 0.00–0.08)

## 2018-05-21 MED ORDER — HYDROCODONE-ACETAMINOPHEN 5-325 MG PO TABS
1.0000 | ORAL_TABLET | Freq: Once | ORAL | Status: AC
  Administered 2018-05-21: 1 via ORAL
  Filled 2018-05-21: qty 1

## 2018-05-21 MED ORDER — IBUPROFEN 600 MG PO TABS: 600.0000 mg | ORAL_TABLET | Freq: Three times a day (TID) | ORAL | 0 refills | Status: DC | PRN

## 2018-05-21 MED ORDER — CYCLOBENZAPRINE HCL 10 MG PO TABS: 10.0000 mg | ORAL_TABLET | Freq: Three times a day (TID) | ORAL | 0 refills | Status: DC | PRN

## 2018-05-21 NOTE — ED Triage Notes (Signed)
Pt reports going to [facility] for a CT scan of her chest (unsure why) but began experiencing chest tightness, and worsening pain in her R shoulder down to her arm.

## 2018-05-21 NOTE — ED Notes (Signed)
Pt called out for pain meds, EDP made aware

## 2018-05-22 NOTE — ED Provider Notes (Signed)
[facility] EMERGENCY DEPARTMENT Provider Note   CSN: [id number] Arrival date & time: 05/21/18  1655     History   Chief Complaint Chief Complaint  Patient presents with  . Shoulder Pain  . Chest Pain    HPI Tammy Thompson is a 62 y.o. female.  HPI Patient is a 62 year old female presents the emergency department left trapezius pain with radiation down her right arm and into her right chest.  She also reports right medial thigh pain.  No history of DVT or pulmonary embolism.  No unilateral leg swelling.  No abdominal pain.  Some pain worse with deep breathing.  The majority the pain is located in her right trapezius however and radiates down the right arm without new midline cervical pain.  No recent injury or trauma.  No recent heavy lifting.  Symptoms are moderate in severity.   Past Medical History:  Diagnosis Date  . Cancer ([place])    colon  . Diabetes mellitus   . Hypertension   . Thyroid disease     There are no active problems to display for this patient.   Past Surgical History:  Procedure Laterality Date  . ABDOMINAL HYSTERECTOMY     Partial*  . BLADDER SURGERY    . COLON SURGERY    . THYROID SURGERY       OB History   None      Home Medications    Prior to Admission medications   Medication Sig Start Date End Date Taking? Authorizing Provider  acyclovir (ZOVIRAX) 200 MG capsule Take 200 mg by mouth daily.   Yes [provider]  aspirin EC 81 MG tablet Take 81 mg by mouth daily.   Yes [provider]  BIOTIN PO Take 1 tablet by mouth daily.   Yes [provider]  gabapentin (NEURONTIN) 400 MG capsule Take 400 mg by mouth at bedtime.   Yes [provider]  glimepiride (AMARYL) 4 MG tablet Take 4 mg by mouth daily.   Yes [provider]  HYDROCHLOROTHIAZIDE PO Take 1 tablet by mouth daily.   Yes [provider]  levothyroxine (SYNTHROID, LEVOTHROID) 25 MCG tablet Take by mouth  daily. NOT SURE OF THE DOSAGE   Yes [provider]  lisinopril (PRINIVIL,ZESTRIL) 40 MG tablet Take 40 mg by mouth daily.   Yes [provider]  metFORMIN (GLUCOPHAGE) 500 MG tablet Take 1,000 mg by mouth daily. Dose not know the DOSE    Yes [provider]  Multiple Vitamin (MULTIVITAMIN) tablet Take 1 tablet by mouth daily.   Yes [provider]  cyclobenzaprine (FLEXERIL) 10 MG tablet Take 1 tablet (10 mg total) by mouth 3 (three) times daily as needed for muscle spasms. 05/21/18   Jola Schmidt, MD  ibuprofen (ADVIL,MOTRIN) 600 MG tablet Take 1 tablet (600 mg total) by mouth every 8 (eight) hours as needed. 05/21/18   Jola Schmidt, MD    Family History Family History  Problem Relation Age of Onset  . Hypertension Daughter     Social History Social History   Tobacco Use  . Smoking status: Former Smoker    Last attempt to quit: 2007    Years since quitting: 12.8  . Smokeless tobacco: Never Used  Substance Use Topics  . Alcohol use: No  . Drug use: Not Currently     Allergies   Patient has no known allergies.   Review of Systems Review of Systems  All other systems reviewed and  are negative.    Physical Exam Updated Vital Signs BP (!) 148/57   Pulse 66   Temp 98.3 F (36.8 C) (Oral)   Resp 18   SpO2 98%   Physical Exam  Constitutional: She is oriented to person, place, and time. She appears well-developed and well-nourished. No distress.  HENT:  Head: Normocephalic and atraumatic.  Eyes: EOM are normal.  Neck: Normal range of motion.  Cardiovascular: Normal rate, regular rhythm and normal heart sounds.  Pulmonary/Chest: Effort normal and breath sounds normal.  Abdominal: Soft. She exhibits no distension. There is no tenderness.  Musculoskeletal: Normal range of motion.  Normal PT and DP pulses bilaterally.  Full range of motion in bilateral upper and lower extremity major joints.  No significant swelling of the right  lower extremity as compared to left.  Mild tenderness over the right medial thigh without palpable abnormality or overlying skin changes.  Normal right radial pulse.  Normal grip strength right hand.  Normal strength in the right upper extremity muscles.  Mild tenderness across the right trapezius.  No C-spine tenderness.  Neurological: She is alert and oriented to person, place, and time.  Skin: Skin is warm and dry.  Psychiatric: She has a normal mood and affect. Judgment normal.  Nursing note and vitals reviewed.    ED Treatments / Results  Labs (all labs ordered are listed, but only abnormal results are displayed) Labs Reviewed  BASIC METABOLIC PANEL - Abnormal; Notable for the following components:      Result Value   Glucose, Bld 306 (*)    All other components within normal limits  CBC  D-DIMER, QUANTITATIVE (NOT AT [facility])  I-STAT TROPONIN, ED    EKG EKG Interpretation  Date/Time:  Tuesday May 21 2018 17:06:30 EDT Ventricular Rate:  79 PR Interval:  156 QRS Duration: 94 QT Interval:  368 QTC Calculation: 421 R Axis:   -12 Text Interpretation:  Normal sinus rhythm Septal infarct , age undetermined Abnormal ECG No significant change was found Confirmed by Jola Schmidt [phone]) on 05/21/2018 7:08:12 PM   Radiology Dg Chest 2 View  Result Date: 05/21/2018 CLINICAL DATA:  Pt reported going to [facility] for a CT scan today of her chest (unsure why) but began experiencing chest tightness, and worsening pain in her R shoulder down to her arm. EXAM: CHEST - 2 VIEW COMPARISON:  Chest x-ray on 11/17/2011 FINDINGS: Heart size is normal. The lungs are free of focal consolidations and pleural effusions. No pulmonary edema. IMPRESSION: No evidence for acute cardiopulmonary abnormality. Electronically Signed   By: Nolon Nations M.D.   On: 05/21/2018 18:11    Procedures Procedures (including critical care time)  Medications Ordered in ED Medications    HYDROcodone-acetaminophen (NORCO/VICODIN) 5-325 MG per tablet 1 tablet (1 tablet Oral Given 05/21/18 2203)     Initial Impression / Assessment and Plan / ED Course  I have reviewed the triage vital signs and the nursing notes.  Pertinent labs & imaging results that were available during my care of the patient were reviewed by me and considered in my medical decision making (see chart for details).     Overall well-appearing.  In regards to her upper extremity symptoms this sounds like a cervical radiculopathy.  No indication for imaging of her neck at this time.  No weakness in her hands bilaterally.  D-dimer was sent but this was negative.  I do not think she needs further evaluation of her right lower extremity at this time.  Low suspicion for DVT.  Primary care follow-up.  Final Clinical Impressions(s) / ED Diagnoses   Final diagnoses:  Cervical radicular pain  Dyspnea, unspecified type    ED Discharge Orders         Ordered    ibuprofen (ADVIL,MOTRIN) 600 MG tablet  Every 8 hours PRN     05/21/18 2200    cyclobenzaprine (FLEXERIL) 10 MG tablet  3 times daily PRN     05/21/18 2200           Jola Schmidt, MD 05/22/18 2355

## 2018-09-27 ENCOUNTER — Encounter (HOSPITAL_COMMUNITY): Payer: Self-pay | Admitting: *Deleted

## 2018-09-27 ENCOUNTER — Other Ambulatory Visit: Payer: Self-pay

## 2018-09-27 ENCOUNTER — Emergency Department (HOSPITAL_COMMUNITY)
Admission: EM | Admit: 2018-09-27 | Discharge: 2018-09-27 | Disposition: A | Discharge status: Home / Self Care | Payer: Non-veteran care | Attending: Emergency Medicine | Admitting: Emergency Medicine

## 2018-09-27 DIAGNOSIS — M25512 Pain in left shoulder: Secondary | ICD-10-CM | POA: Insufficient documentation

## 2018-09-27 DIAGNOSIS — M25511 Pain in right shoulder: Secondary | ICD-10-CM | POA: Diagnosis not present

## 2018-09-27 DIAGNOSIS — R8290 Unspecified abnormal findings in urine: Secondary | ICD-10-CM | POA: Diagnosis not present

## 2018-09-27 DIAGNOSIS — R109 Unspecified abdominal pain: Secondary | ICD-10-CM | POA: Diagnosis present

## 2018-09-27 DIAGNOSIS — Z5321 Procedure and treatment not carried out due to patient leaving prior to being seen by health care provider: Secondary | ICD-10-CM | POA: Diagnosis not present

## 2018-09-27 DIAGNOSIS — M542 Cervicalgia: Secondary | ICD-10-CM | POA: Insufficient documentation

## 2018-09-27 NOTE — ED Triage Notes (Signed)
Pt has multiple complaints; pt initially c/o right flank pain and "foamy" urine; pt then stated she is having bilateral shoulder pain and neck pain

## 2018-10-01 ENCOUNTER — Other Ambulatory Visit: Payer: Self-pay

## 2018-10-01 ENCOUNTER — Emergency Department (HOSPITAL_COMMUNITY)
Admission: EM | Admit: 2018-10-01 | Discharge: 2018-10-01 | Disposition: A | Discharge status: Home / Self Care | Payer: No Typology Code available for payment source | Attending: Emergency Medicine | Admitting: Emergency Medicine

## 2018-10-01 ENCOUNTER — Encounter (HOSPITAL_COMMUNITY): Payer: Self-pay | Admitting: Emergency Medicine

## 2018-10-01 ENCOUNTER — Emergency Department (HOSPITAL_COMMUNITY): Payer: No Typology Code available for payment source

## 2018-10-01 DIAGNOSIS — I10 Essential (primary) hypertension: Secondary | ICD-10-CM | POA: Insufficient documentation

## 2018-10-01 DIAGNOSIS — Z85038 Personal history of other malignant neoplasm of large intestine: Secondary | ICD-10-CM | POA: Insufficient documentation

## 2018-10-01 DIAGNOSIS — Z79899 Other long term (current) drug therapy: Secondary | ICD-10-CM | POA: Insufficient documentation

## 2018-10-01 DIAGNOSIS — M545 Low back pain, unspecified: Secondary | ICD-10-CM

## 2018-10-01 DIAGNOSIS — Z87891 Personal history of nicotine dependence: Secondary | ICD-10-CM | POA: Insufficient documentation

## 2018-10-01 DIAGNOSIS — E119 Type 2 diabetes mellitus without complications: Secondary | ICD-10-CM | POA: Diagnosis not present

## 2018-10-01 DIAGNOSIS — Z7984 Long term (current) use of oral hypoglycemic drugs: Secondary | ICD-10-CM | POA: Insufficient documentation

## 2018-10-01 DIAGNOSIS — M542 Cervicalgia: Secondary | ICD-10-CM | POA: Insufficient documentation

## 2018-10-01 DIAGNOSIS — Z7982 Long term (current) use of aspirin: Secondary | ICD-10-CM | POA: Insufficient documentation

## 2018-10-01 LAB — URINALYSIS, ROUTINE W REFLEX MICROSCOPIC
Bilirubin Urine: NEGATIVE
GLUCOSE, UA: 50 mg/dL — AB
HGB URINE DIPSTICK: NEGATIVE
KETONES UR: NEGATIVE mg/dL
NITRITE: NEGATIVE
PROTEIN: NEGATIVE mg/dL
Specific Gravity, Urine: 1.009 (ref 1.005–1.030)
pH: 6 (ref 5.0–8.0)

## 2018-10-01 MED ORDER — METHOCARBAMOL 500 MG PO TABS: 500.0000 mg | ORAL_TABLET | Freq: Two times a day (BID) | ORAL | 0 refills | Status: DC

## 2018-10-01 MED ORDER — PREDNISONE 10 MG PO TABS: ORAL_TABLET | ORAL | 0 refills | Status: DC

## 2018-10-01 MED ORDER — HYDROCODONE-ACETAMINOPHEN 5-325 MG PO TABS: 2.0000 | ORAL_TABLET | ORAL | 0 refills | Status: DC | PRN

## 2018-10-01 NOTE — Discharge Instructions (Addendum)
See your Physician for recheck.  Monitor your glucose carefully.

## 2018-10-01 NOTE — ED Provider Notes (Signed)
[facility] EMERGENCY DEPARTMENT Provider Note   CSN: [id number] Arrival date & time: 10/01/18  1159    History   Chief Complaint Chief Complaint  Patient presents with  . Back Pain    HPI Tammy Thompson is a 63 y.o. female.     The history is provided by the patient. No language interpreter was used.  Back Pain  Location:  Lumbar spine Quality:  Aching Radiates to:  Does not radiate Pain severity:  Moderate Pain is:  Same all the time Onset quality:  Gradual Timing:  Constant Progression:  Worsening Chronicity:  New Relieved by:  Nothing Worsened by:  Nothing Ineffective treatments:  None tried Pt complains of pain in her neck that goes down her arm.  Pt complains of numbness in her right thumb and.  Pt also reports pain in her right low back.  Pt reports she feels like neck pain has moved down her back.  Pt reports a history of colon cancer.   Past Medical History:  Diagnosis Date  . Cancer ([place])    colon  . Diabetes mellitus   . Hypertension   . Thyroid disease     There are no active problems to display for this patient.   Past Surgical History:  Procedure Laterality Date  . ABDOMINAL HYSTERECTOMY     Partial*  . BLADDER SURGERY    . COLON SURGERY    . THYROID SURGERY       OB History   No obstetric history on file.      Home Medications    Prior to Admission medications   Medication Sig Start Date End Date Taking? Authorizing Provider  acyclovir (ZOVIRAX) 200 MG capsule Take 200 mg by mouth daily.    [provider]  aspirin EC 81 MG tablet Take 81 mg by mouth daily.    [provider]  BIOTIN PO Take 1 tablet by mouth daily.    [provider]  cyclobenzaprine (FLEXERIL) 10 MG tablet Take 1 tablet (10 mg total) by mouth 3 (three) times daily as needed for muscle spasms. 05/21/18   Jola Schmidt, MD  gabapentin (NEURONTIN) 400 MG capsule Take 400 mg by mouth at bedtime.    [provider]  glimepiride  (AMARYL) 4 MG tablet Take 4 mg by mouth daily.    [provider]  HYDROCHLOROTHIAZIDE PO Take 1 tablet by mouth daily.    [provider]  HYDROcodone-acetaminophen (NORCO/VICODIN) 5-325 MG tablet Take 2 tablets by mouth every 4 (four) hours as needed. 10/01/18   Fransico Meadow, PA-C  ibuprofen (ADVIL,MOTRIN) 600 MG tablet Take 1 tablet (600 mg total) by mouth every 8 (eight) hours as needed. 05/21/18   Jola Schmidt, MD  levothyroxine (SYNTHROID, LEVOTHROID) 25 MCG tablet Take by mouth daily. NOT SURE OF THE DOSAGE    [provider]  lisinopril (PRINIVIL,ZESTRIL) 40 MG tablet Take 40 mg by mouth daily.    [provider]  metFORMIN (GLUCOPHAGE) 500 MG tablet Take 1,000 mg by mouth daily. Dose not know the DOSE     [provider]  methocarbamol (ROBAXIN) 500 MG tablet Take 1 tablet (500 mg total) by mouth 2 (two) times daily. 10/01/18   Fransico Meadow, PA-C  Multiple Vitamin (MULTIVITAMIN) tablet Take 1 tablet by mouth daily.    [provider]  predniSONE (DELTASONE) 10 MG tablet 6,5,4,3,2,1 taper 10/01/18   Fransico Meadow, PA-C    Family History Family History  Problem Relation  Age of Onset  . Hypertension Daughter     Social History Social History   Tobacco Use  . Smoking status: Former Smoker    Last attempt to quit: 2007    Years since quitting: 13.1  . Smokeless tobacco: Never Used  Substance Use Topics  . Alcohol use: No  . Drug use: Not Currently     Allergies   Patient has no known allergies.   Review of Systems Review of Systems  Musculoskeletal: Positive for back pain.  All other systems reviewed and are negative.    Physical Exam Updated Vital Signs BP (!) 146/71 (BP Location: Right Arm)   Pulse 87   Temp 98 F (36.7 C) (Temporal)   Resp 20   Ht 5\' 3"  (1.6 m)   Wt 71.7 kg   SpO2 99%   BMI 27.99 kg/m   Physical Exam Vitals signs and nursing note reviewed.  Constitutional:      Appearance: She  is well-developed.  HENT:     Head: Normocephalic.     Mouth/Throat:     Mouth: Mucous membranes are moist.  Neck:     Musculoskeletal: Normal range of motion.  Cardiovascular:     Rate and Rhythm: Normal rate.     Pulses: Normal pulses.  Pulmonary:     Effort: Pulmonary effort is normal.  Abdominal:     General: There is no distension.  Musculoskeletal: Normal range of motion.     Comments: Tender cervical spine diffusely,  Tender ls spine right lateral,    Skin:    General: Skin is warm.  Neurological:     Mental Status: She is alert and oriented to person, place, and time.      ED Treatments / Results  Labs (all labs ordered are listed, but only abnormal results are displayed) Labs Reviewed  URINALYSIS, ROUTINE W REFLEX MICROSCOPIC - Abnormal; Notable for the following components:      Result Value   Color, Urine STRAW (*)    Glucose, UA 50 (*)    Leukocytes,Ua TRACE (*)    Bacteria, UA RARE (*)    All other components within normal limits    EKG None  Radiology Dg Cervical Spine Complete  Result Date: 10/01/2018 CLINICAL DATA:  Neck pain right arm pain EXAM: CERVICAL SPINE - COMPLETE 4+ VIEW COMPARISON:  None. FINDINGS: Normal alignment.  Negative for fracture Multilevel degenerative change throughout the cervical spine. Disc and facet degeneration is present at multiple levels. Moderate to severe right foraminal encroachment C2-3 and C3-4 due to facet hypertrophy and uncinate spurring. Mild right foraminal narrowing C4-5. Moderate right foraminal narrowing C5-6. Mild left foraminal narrowing C4-5 and moderate left foraminal narrowing C5-6 due to uncinate spurring. IMPRESSION: Cervical spondylosis. Foraminal encroachment bilaterally due to spurring as described above. Electronically Signed   By: Franchot Gallo M.D.   On: 10/01/2018 14:18   Dg Lumbar Spine Complete  Result Date: 10/01/2018 CLINICAL DATA:  Low back pain EXAM: LUMBAR SPINE - COMPLETE 4+ VIEW COMPARISON:   None. FINDINGS: 5 mm anterolisthesis L4-5 appears degenerative with advanced facet degeneration at L4-5. Remaining alignment normal. Negative for fracture. No significant disc space narrowing. IMPRESSION: Degenerative anterolisthesis L4-5 with severe facet degeneration at L4-5. Electronically Signed   By: Franchot Gallo M.D.   On: 10/01/2018 14:19    Procedures Procedures (including critical care time)  Medications Ordered in ED Medications - No data to display   Initial Impression / Assessment and Plan / ED Course  I have reviewed the triage vital signs and the nursing notes.  Pertinent labs & imaging results that were available during my care of the patient were reviewed by me and considered in my medical decision making (see chart for details).        MDM  xrays reviewed and discussed with pt.  I will treat with prednisone, hydrocodone and robaxin.  Pt advised to monitor her glucose carefully.  Pt advised to see her Gi doctor for recheck to make sure back pain not related to colon issues   Final Clinical Impressions(s) / ED Diagnoses   Final diagnoses:  Neck pain  Low back pain without sciatica, unspecified back pain laterality, unspecified chronicity    ED Discharge Orders         Ordered    predniSONE (DELTASONE) 10 MG tablet     10/01/18 1444    HYDROcodone-acetaminophen (NORCO/VICODIN) 5-325 MG tablet  Every 4 hours PRN     10/01/18 1444    methocarbamol (ROBAXIN) 500 MG tablet  2 times daily     10/01/18 1444        An After Visit Summary was printed and given to the patient.    Fransico Meadow, PA-C 10/01/18 1601    Francine Graven, DO 10/05/18 1843

## 2018-10-01 NOTE — ED Triage Notes (Signed)
Pt reports she is having R shoulder pain and back pain. Denies injury. Pt is freely moving all extremities and has normal gait. Denies GU/GI sx. No OTC medications.

## 2019-05-09 ENCOUNTER — Other Ambulatory Visit: Payer: Self-pay

## 2019-05-09 DIAGNOSIS — Z20822 Contact with and (suspected) exposure to covid-19: Secondary | ICD-10-CM

## 2019-05-11 LAB — NOVEL CORONAVIRUS, NAA: SARS-CoV-2, NAA: NOT DETECTED

## 2019-06-06 ENCOUNTER — Emergency Department (HOSPITAL_COMMUNITY): Payer: No Typology Code available for payment source

## 2019-06-06 ENCOUNTER — Inpatient Hospital Stay (HOSPITAL_COMMUNITY)
Admission: EM | Admit: 2019-06-06 | Discharge: 2019-06-12 | DRG: 390 | Disposition: A | Discharge status: Home / Self Care | Payer: No Typology Code available for payment source | Attending: Internal Medicine | Admitting: Internal Medicine

## 2019-06-06 ENCOUNTER — Encounter (HOSPITAL_COMMUNITY): Payer: Self-pay

## 2019-06-06 ENCOUNTER — Other Ambulatory Visit: Payer: Self-pay

## 2019-06-06 DIAGNOSIS — Z85038 Personal history of other malignant neoplasm of large intestine: Secondary | ICD-10-CM

## 2019-06-06 DIAGNOSIS — Z7982 Long term (current) use of aspirin: Secondary | ICD-10-CM

## 2019-06-06 DIAGNOSIS — E119 Type 2 diabetes mellitus without complications: Secondary | ICD-10-CM | POA: Diagnosis present

## 2019-06-06 DIAGNOSIS — Z9049 Acquired absence of other specified parts of digestive tract: Secondary | ICD-10-CM | POA: Diagnosis not present

## 2019-06-06 DIAGNOSIS — Z7984 Long term (current) use of oral hypoglycemic drugs: Secondary | ICD-10-CM | POA: Diagnosis not present

## 2019-06-06 DIAGNOSIS — Z8249 Family history of ischemic heart disease and other diseases of the circulatory system: Secondary | ICD-10-CM

## 2019-06-06 DIAGNOSIS — K56609 Unspecified intestinal obstruction, unspecified as to partial versus complete obstruction: Secondary | ICD-10-CM | POA: Diagnosis present

## 2019-06-06 DIAGNOSIS — E039 Hypothyroidism, unspecified: Secondary | ICD-10-CM | POA: Diagnosis present

## 2019-06-06 DIAGNOSIS — Z79890 Hormone replacement therapy (postmenopausal): Secondary | ICD-10-CM

## 2019-06-06 DIAGNOSIS — R1031 Right lower quadrant pain: Secondary | ICD-10-CM | POA: Diagnosis not present

## 2019-06-06 DIAGNOSIS — R509 Fever, unspecified: Secondary | ICD-10-CM | POA: Diagnosis not present

## 2019-06-06 DIAGNOSIS — K5651 Intestinal adhesions [bands], with partial obstruction: Principal | ICD-10-CM | POA: Diagnosis present

## 2019-06-06 DIAGNOSIS — Z87891 Personal history of nicotine dependence: Secondary | ICD-10-CM | POA: Diagnosis not present

## 2019-06-06 DIAGNOSIS — Z4659 Encounter for fitting and adjustment of other gastrointestinal appliance and device: Secondary | ICD-10-CM

## 2019-06-06 DIAGNOSIS — R109 Unspecified abdominal pain: Secondary | ICD-10-CM

## 2019-06-06 DIAGNOSIS — I10 Essential (primary) hypertension: Secondary | ICD-10-CM | POA: Diagnosis present

## 2019-06-06 DIAGNOSIS — K219 Gastro-esophageal reflux disease without esophagitis: Secondary | ICD-10-CM | POA: Diagnosis not present

## 2019-06-06 DIAGNOSIS — Z20828 Contact with and (suspected) exposure to other viral communicable diseases: Secondary | ICD-10-CM | POA: Diagnosis present

## 2019-06-06 DIAGNOSIS — C801 Malignant (primary) neoplasm, unspecified: Secondary | ICD-10-CM | POA: Diagnosis present

## 2019-06-06 LAB — CBC WITH DIFFERENTIAL/PLATELET
Abs Immature Granulocytes: 0.03 10*3/uL (ref 0.00–0.07)
Basophils Absolute: 0 10*3/uL (ref 0.0–0.1)
Basophils Relative: 0 %
Eosinophils Absolute: 0 10*3/uL (ref 0.0–0.5)
Eosinophils Relative: 0 %
HCT: 44.9 % (ref 36.0–46.0)
Hemoglobin: 13.9 g/dL (ref 12.0–15.0)
Immature Granulocytes: 0 %
Lymphocytes Relative: 9 %
Lymphs Abs: 0.9 10*3/uL (ref 0.7–4.0)
MCH: 29.5 pg (ref 26.0–34.0)
MCHC: 31 g/dL (ref 30.0–36.0)
MCV: 95.3 fL (ref 80.0–100.0)
Monocytes Absolute: 0.3 10*3/uL (ref 0.1–1.0)
Monocytes Relative: 3 %
Neutro Abs: 8.6 10*3/uL — ABNORMAL HIGH (ref 1.7–7.7)
Neutrophils Relative %: 88 %
Platelets: 229 10*3/uL (ref 150–400)
RBC: 4.71 MIL/uL (ref 3.87–5.11)
RDW: 12.4 % (ref 11.5–15.5)
WBC: 9.9 10*3/uL (ref 4.0–10.5)
nRBC: 0 % (ref 0.0–0.2)

## 2019-06-06 LAB — COMPREHENSIVE METABOLIC PANEL
ALT: 20 U/L (ref 0–44)
AST: 17 U/L (ref 15–41)
Albumin: 4.5 g/dL (ref 3.5–5.0)
Alkaline Phosphatase: 55 U/L (ref 38–126)
Anion gap: 12 (ref 5–15)
BUN: 13 mg/dL (ref 8–23)
CO2: 31 mmol/L (ref 22–32)
Calcium: 10 mg/dL (ref 8.9–10.3)
Chloride: 97 mmol/L — ABNORMAL LOW (ref 98–111)
Creatinine, Ser: 0.75 mg/dL (ref 0.44–1.00)
GFR calc Af Amer: >60 mL/min (ref >60)
GFR calc non Af Amer: >60 mL/min (ref >60)
Glucose, Bld: 288 mg/dL — ABNORMAL HIGH (ref 70–99)
Potassium: 4.7 mmol/L (ref 3.5–5.1)
Sodium: 140 mmol/L (ref 135–145)
Total Bilirubin: 0.8 mg/dL (ref 0.3–1.2)
Total Protein: 7.4 g/dL (ref 6.5–8.1)

## 2019-06-06 LAB — URINALYSIS, ROUTINE W REFLEX MICROSCOPIC
Bacteria, UA: NONE SEEN
Bilirubin Urine: NEGATIVE
Glucose, UA: >=500 mg/dL — AB
Hgb urine dipstick: NEGATIVE
Ketones, ur: 20 mg/dL — AB
Leukocytes,Ua: NEGATIVE
Nitrite: NEGATIVE
Protein, ur: NEGATIVE mg/dL
Specific Gravity, Urine: 1.026 (ref 1.005–1.030)
pH: 6 (ref 5.0–8.0)

## 2019-06-06 LAB — LIPASE, BLOOD: Lipase: 18 U/L (ref 11–51)

## 2019-06-06 LAB — CBG MONITORING, ED
Glucose-Capillary: 188 mg/dL — ABNORMAL HIGH (ref 70–99)
Glucose-Capillary: 169 mg/dL — ABNORMAL HIGH (ref 70–99)

## 2019-06-06 LAB — POC OCCULT BLOOD, ED: Fecal Occult Bld: NEGATIVE

## 2019-06-06 MED ORDER — PROCHLORPERAZINE EDISYLATE 10 MG/2ML IJ SOLN
5.0000 mg | Freq: Once | INTRAMUSCULAR | Status: AC
  Administered 2019-06-06: 5 mg via INTRAVENOUS
  Filled 2019-06-06: qty 2

## 2019-06-06 MED ORDER — LISINOPRIL 10 MG PO TABS
40.0000 mg | ORAL_TABLET | Freq: Every day | ORAL | Status: DC
  Administered 2019-06-06 – 2019-06-07 (×2): 40 mg via ORAL
  Filled 2019-06-06 (×2): qty 4

## 2019-06-06 MED ORDER — SODIUM CHLORIDE 0.9 % IV BOLUS
1000.0000 mL | Freq: Once | INTRAVENOUS | Status: AC
  Administered 2019-06-06: 1000 mL via INTRAVENOUS

## 2019-06-06 MED ORDER — KETOROLAC TROMETHAMINE 15 MG/ML IJ SOLN
15.0000 mg | Freq: Four times a day (QID) | INTRAMUSCULAR | Status: DC | PRN
  Administered 2019-06-07 – 2019-06-09 (×6): 15 mg via INTRAVENOUS
  Filled 2019-06-06 (×6): qty 1

## 2019-06-06 MED ORDER — LEVOTHYROXINE SODIUM 25 MCG PO TABS
25.0000 ug | ORAL_TABLET | Freq: Every day | ORAL | Status: DC
  Administered 2019-06-07 – 2019-06-12 (×4): 25 ug via ORAL
  Filled 2019-06-06 (×5): qty 1

## 2019-06-06 MED ORDER — SODIUM CHLORIDE 0.9 % IV SOLN
INTRAVENOUS | Status: DC
  Administered 2019-06-06 – 2019-06-11 (×11): via INTRAVENOUS

## 2019-06-06 MED ORDER — GABAPENTIN 400 MG PO CAPS
400.0000 mg | ORAL_CAPSULE | Freq: Every day | ORAL | Status: DC
  Administered 2019-06-06 – 2019-06-11 (×4): 400 mg via ORAL
  Filled 2019-06-06 (×4): qty 1

## 2019-06-06 MED ORDER — METHOCARBAMOL 500 MG PO TABS
500.0000 mg | ORAL_TABLET | Freq: Two times a day (BID) | ORAL | Status: DC
  Administered 2019-06-06 – 2019-06-07 (×2): 500 mg via ORAL
  Filled 2019-06-06 (×2): qty 1

## 2019-06-06 MED ORDER — HYDROMORPHONE HCL 1 MG/ML IJ SOLN
0.5000 mg | Freq: Once | INTRAMUSCULAR | Status: AC
  Administered 2019-06-06: 0.5 mg via INTRAVENOUS
  Filled 2019-06-06: qty 1

## 2019-06-06 MED ORDER — INSULIN ASPART 100 UNIT/ML ~~LOC~~ SOLN
0.0000 [IU] | Freq: Four times a day (QID) | SUBCUTANEOUS | Status: DC
  Administered 2019-06-06 – 2019-06-07 (×2): 3 [IU] via SUBCUTANEOUS
  Administered 2019-06-07: 2 [IU] via SUBCUTANEOUS
  Filled 2019-06-06 (×2): qty 1

## 2019-06-06 MED ORDER — IOHEXOL 300 MG/ML  SOLN
100.0000 mL | Freq: Once | INTRAMUSCULAR | Status: AC | PRN
  Administered 2019-06-06: 100 mL via INTRAVENOUS

## 2019-06-06 MED ORDER — ONDANSETRON HCL 4 MG PO TABS: 4.0000 mg | ORAL_TABLET | Freq: Four times a day (QID) | ORAL | Status: DC | PRN

## 2019-06-06 MED ORDER — FAMOTIDINE IN NACL 20-0.9 MG/50ML-% IV SOLN
20.0000 mg | Freq: Two times a day (BID) | INTRAVENOUS | Status: DC
  Administered 2019-06-06 – 2019-06-12 (×12): 20 mg via INTRAVENOUS
  Filled 2019-06-06 (×12): qty 50

## 2019-06-06 MED ORDER — SODIUM CHLORIDE 0.9 % IV SOLN
INTRAVENOUS | Status: DC
  Administered 2019-06-06: 11:00:00 via INTRAVENOUS

## 2019-06-06 MED ORDER — ONDANSETRON HCL 4 MG/2ML IJ SOLN
4.0000 mg | Freq: Four times a day (QID) | INTRAMUSCULAR | Status: DC | PRN
  Administered 2019-06-08 – 2019-06-09 (×4): 4 mg via INTRAVENOUS
  Filled 2019-06-06 (×4): qty 2

## 2019-06-06 MED ORDER — ASPIRIN EC 81 MG PO TBEC
81.0000 mg | DELAYED_RELEASE_TABLET | Freq: Every day | ORAL | Status: DC
  Administered 2019-06-06 – 2019-06-09 (×4): 81 mg via ORAL
  Filled 2019-06-06 (×4): qty 1

## 2019-06-06 NOTE — ED Provider Notes (Signed)
[facility] EMERGENCY DEPARTMENT Provider Note   CSN: [id number] Arrival date & time: 06/06/19  [id number]     History   Chief Complaint Chief Complaint  Patient presents with  . Abdominal Pain    HPI Gwenlyn Recalde is a 63 y.o. female.     Patient is a 63 year old female who presents to the emergency department with a complaint of abdominal pain.  Patient states that she has been having problems with stomach spasms since yesterday November 5.  She complains of pain across the upper portion of her abdomen.  She says she has had probably 10 episodes of vomiting in the last 24 hours.  She said pain and spasm through the night according to the patient.  She says she had a history of something similar about a year ago when she had a virus, but she said this is much worse.  Is been no fever reported.  No blood in the vomitus.  No diarrhea reported.  No recent injury or trauma to the abdomen.  No recent operations or procedures.  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  It is also of note that the patient had colon cancer in 2008, but has been cancer free since that time.  Nothing makes the pain any better, and nothing makes the pain any worse.  The history is provided by the patient.  Abdominal Pain Associated symptoms: nausea and vomiting   Associated symptoms: no chest pain, no constipation, no cough, no diarrhea, no dysuria, no hematuria and no shortness of breath     Past Medical History:  Diagnosis Date  . Cancer ([place])    colon  . Diabetes mellitus   . Hypertension   . Thyroid disease     There are no active problems to display for this patient.   Past Surgical History:  Procedure Laterality Date  . ABDOMINAL HYSTERECTOMY     Partial*  . BLADDER SURGERY    . COLON SURGERY    . THYROID SURGERY       OB History   No obstetric history on file.      Home  Medications    Prior to Admission medications   Medication Sig Start Date End Date Taking? Authorizing Provider  acyclovir (ZOVIRAX) 200 MG capsule Take 200 mg by mouth daily.    [provider]  aspirin EC 81 MG tablet Take 81 mg by mouth daily.    [provider]  BIOTIN PO Take 1 tablet by mouth daily.    [provider]  cyclobenzaprine (FLEXERIL) 10 MG tablet Take 1 tablet (10 mg total) by mouth 3 (three) times daily as needed for muscle spasms. 05/21/18   Jola Schmidt, MD  gabapentin (NEURONTIN) 400 MG capsule Take 400 mg by mouth at bedtime.    [provider]  glimepiride (AMARYL) 4 MG tablet Take 4 mg by mouth daily.    [provider]  HYDROCHLOROTHIAZIDE PO Take 1 tablet by mouth daily.    [provider]  HYDROcodone-acetaminophen (NORCO/VICODIN) 5-325 MG tablet Take 2 tablets by mouth every 4 (four) hours as needed. 10/01/18   Fransico Meadow, PA-C  ibuprofen (ADVIL,MOTRIN) 600 MG tablet Take 1 tablet (600 mg total) by mouth every 8 (eight) hours as needed. 05/21/18   Jola Schmidt, MD  levothyroxine (SYNTHROID, LEVOTHROID) 25 MCG tablet Take by mouth daily. NOT SURE  OF THE DOSAGE    [provider]  lisinopril (PRINIVIL,ZESTRIL) 40 MG tablet Take 40 mg by mouth daily.    [provider]  metFORMIN (GLUCOPHAGE) 500 MG tablet Take 1,000 mg by mouth daily. Dose not know the DOSE     [provider]  methocarbamol (ROBAXIN) 500 MG tablet Take 1 tablet (500 mg total) by mouth 2 (two) times daily. 10/01/18   Fransico Meadow, PA-C  Multiple Vitamin (MULTIVITAMIN) tablet Take 1 tablet by mouth daily.    [provider]  predniSONE (DELTASONE) 10 MG tablet 6,5,4,3,2,1 taper 10/01/18   Fransico Meadow, PA-C    Family History Family History  Problem Relation Age of Onset  . Hypertension Daughter     Social History Social History   Tobacco Use  . Smoking status: Former Smoker    Quit date: 2007     Years since quitting: 13.8  . Smokeless tobacco: Never Used  Substance Use Topics  . Alcohol use: No  . Drug use: Not Currently     Allergies   Patient has no known allergies.   Review of Systems Review of Systems  Constitutional: Negative for activity change and appetite change.  HENT: Negative for congestion, ear discharge, ear pain, facial swelling, nosebleeds, rhinorrhea, sneezing and tinnitus.   Eyes: Negative for photophobia, pain and discharge.  Respiratory: Negative for cough, choking, shortness of breath and wheezing.   Cardiovascular: Negative for chest pain, palpitations and leg swelling.  Gastrointestinal: Positive for abdominal pain, nausea and vomiting. Negative for blood in stool, constipation and diarrhea.  Genitourinary: Negative for difficulty urinating, dysuria, flank pain, frequency and hematuria.  Musculoskeletal: Negative for back pain, gait problem, myalgias and neck pain.  Skin: Negative for color change, rash and wound.  Neurological: Negative for dizziness, seizures, syncope, facial asymmetry, speech difficulty, weakness and numbness.  Hematological: Negative for adenopathy. Does not bruise/bleed easily.  Psychiatric/Behavioral: Negative for agitation, confusion, hallucinations, self-injury and suicidal ideas. The patient is not nervous/anxious.      Physical Exam Updated Vital Signs BP (!) 160/86 (BP Location: Left Arm)   Pulse 71   Temp 98.4 F (36.9 C)   Resp 18   Ht 5\' 3"  (1.6 m)   Wt 72.6 kg   SpO2 96%   BMI 28.34 kg/m   Physical Exam Vitals signs and nursing note reviewed.  Constitutional:      Appearance: She is well-developed. She is ill-appearing. She is not toxic-appearing.  HENT:     Head: Normocephalic.     Right Ear: Tympanic membrane and external ear normal.     Left Ear: Tympanic membrane and external ear normal.  Eyes:     General: Lids are normal. No scleral icterus.    Pupils: Pupils are equal, round, and reactive to  light.  Neck:     Musculoskeletal: Normal range of motion and neck supple.     Vascular: No carotid bruit.  Cardiovascular:     Rate and Rhythm: Normal rate and regular rhythm.     Pulses: Normal pulses.     Heart sounds: Normal heart sounds. No murmur. No friction rub.  Pulmonary:     Effort: No respiratory distress.     Breath sounds: Normal breath sounds. No stridor. No wheezing or rhonchi.  Abdominal:     General: Bowel sounds are normal.     Palpations: Abdomen is soft. There is no mass or pulsatile mass.     Tenderness: There is abdominal tenderness in  the right upper quadrant, epigastric area and left upper quadrant. There is guarding.    Musculoskeletal: Normal range of motion.  Lymphadenopathy:     Head:     Right side of head: No submandibular adenopathy.     Left side of head: No submandibular adenopathy.     Cervical: No cervical adenopathy.  Skin:    General: Skin is warm and dry.  Neurological:     Mental Status: She is alert and oriented to person, place, and time.     Cranial Nerves: No cranial nerve deficit.     Sensory: No sensory deficit.  Psychiatric:        Speech: Speech normal.      ED Treatments / Results  Labs (all labs ordered are listed, but only abnormal results are displayed) Labs Reviewed  COMPREHENSIVE METABOLIC PANEL  LIPASE, BLOOD  CBC WITH DIFFERENTIAL/PLATELET  URINALYSIS, ROUTINE W REFLEX MICROSCOPIC  OCCULT BLOOD X 1 CARD TO LAB, STOOL    EKG None  Radiology No results found.  Procedures Procedures (including critical care time)  Medications Ordered in ED Medications  sodium chloride 0.9 % bolus 1,000 mL (has no administration in time range)    And  0.9 %  sodium chloride infusion (has no administration in time range)  HYDROmorphone (DILAUDID) injection 0.5 mg (has no administration in time range)  prochlorperazine (COMPAZINE) injection 5 mg (has no administration in time range)     Initial Impression / Assessment  and Plan / ED Course  I have reviewed the triage vital signs and the nursing notes.  Pertinent labs & imaging results that were available during my care of the patient were reviewed by me and considered in my medical decision making (see chart for details).         Final Clinical Impressions(s) / ED Diagnoses MDM  Blood pressure slightly elevated at 160/86, otherwise vital signs within normal limits.  Pulse oximetry is 96% on room air.  Within normal limits by my interpretation.  Patient presents with upper abdomen pain, nausea, and vomiting.  Chest x-ray is negative for acute changes.  Complete blood count is negative for acute changes.  Recheck.  Patient states she is feeling some better after medication for nausea and for pain, but remains sore in the right upper quadrant, epigastric area, and left upper quadrant.  The ultrasound is negative for any acute changes.  I have updated the patient on her findings up to this point.  CT abdomen scan ordered.  Stool for occult blood is negative.  Lipase is negative.  Glucose is 288, otherwise the comprehensive metabolic panel is within normal limits.  Patient reports abdominal pain has returned.  IV pain medications given.  CT findings are compatible with mechanical small bowel obstruction in the mid to distal small bowel with a suggestion of a transition point in the right lower quadrant.  This favors adhesions as the cause.  There is nonspecific mild wall thickening with in a few of the mildly dilated small bowel loops in the right abdomen.  No definite pneumatosis or and no pneumoperitoneum.  There is also noted subcentimeter low-attenuation anterior pancreatic head lesion not seen on the 06/25/2017 outside CT scan study.  I discussed these findings with the patient in terms which he understands.  She is in agreement for admission for small bowel obstruction.  Call placed to the hospitalist, as well as to general surgery. Pt to be admitted.    Final diagnoses:  SBO (small bowel  obstruction) [facility])    ED Discharge Orders    None       Lily Kocher, PA-C 06/06/19 1712    Virgel Manifold, MD 06/07/19 [phone]

## 2019-06-06 NOTE — ED Triage Notes (Signed)
Pt reports abdominal pain started yesterday. Pt has been vomiting during the night. No diarhea

## 2019-06-06 NOTE — ED Provider Notes (Signed)
Care handoff received from [facility], PA-C at shift change.  See his note for full details of visit  Hemoccult negative Lipase within normal limits CBC nonacute CMP with glucose 298 otherwise nonacute Urinalysis with glucose and ketones Chest x-ray:    IMPRESSION:  Negative for acute cardiopulmonary disease   [place] Abd:  IMPRESSION:  1. Hepatic steatosis and mildly coarsened echotexture. Correlate  with any clinical or laboratory evidence of liver disease.  2. No acute findings.   CT AP:  IMPRESSION:  1. CT findings are compatible with mechanical small-bowel  obstruction in the mid to distal small bowel, with suggestion of  transition point in the right lower quadrant. Favor adhesions as the  cause given postsurgical changes from subtotal right hemicolectomy  with ileocolic anastomosis in the right abdomen.  2. Nonspecific mild wall thickening within a few of the mildly  dilated small bowel loops in the right abdomen. No definite  pneumatosis. No pneumoperitoneum. Trace free fluid in the pelvis and  right abdomen.  3. No lymphadenopathy or other findings of metastatic disease in the  abdomen or pelvis.  4. Subcentimeter low-attenuation anterior pancreatic head lesion,  not definitely seen on 06/25/2017 outside CT abdomen study. No  biliary or pancreatic duct dilation. Suggest short-term outpatient  follow-up MRI abdomen without and with IV contrast for further  characterization.  5. Small hiatal hernia.  6. Aortic Atherosclerosis (ICD10-I70.0).   EKG without acute findings reviewed by Dr. Wilson Singer - Previous provider consulted hospitalist Dr. Nehemiah Settle who is admitting the patient.  There is a consult out to general surgery for their input, plan of care at shift change is to follow-up on phone call to general surgery and relay the information to Dr. Nehemiah Settle. Physical Exam  BP (!) 156/86 (BP Location: Right Arm)   Pulse 85   Temp 99.2 F (37.3 C) (Oral)   Resp 14   Ht 5'  3" (1.6 m)   Wt 72.6 kg   SpO2 94%   BMI 28.34 kg/m   Physical Exam Constitutional:      General: She is not in acute distress.    Appearance: Normal appearance. She is well-developed. She is not ill-appearing or diaphoretic.  HENT:     Head: Normocephalic and atraumatic.     Right Ear: External ear normal.     Left Ear: External ear normal.     Nose: Nose normal.  Eyes:     General: Vision grossly intact. Gaze aligned appropriately.     Pupils: Pupils are equal, round, and reactive to light.  Neck:     Musculoskeletal: Normal range of motion.     Trachea: Trachea and phonation normal. No tracheal deviation.  Pulmonary:     Effort: Pulmonary effort is normal. No respiratory distress.  Musculoskeletal: Normal range of motion.  Skin:    General: Skin is warm and dry.  Neurological:     Mental Status: She is alert.     GCS: GCS eye subscore is 4. GCS verbal subscore is 5. GCS motor subscore is 6.     Comments: Speech is clear and goal oriented, follows commands Major Cranial nerves without deficit, no facial droop Moves extremities without ataxia, coordination intact  Psychiatric:        Behavior: Behavior normal.     ED Course/Procedures     Procedures  MDM  Discussed case with general surgeon Dr. Arnoldo Morale who advises patient may be admitted here at [facility] and he will be by to  see patient tomorrow morning.  I then called Dr. Sheran Lawless to inform him of Dr. Arnoldo Morale plan. - Patient reevaluated sitting comfortably no acute distress states improvement of symptoms since arrival.  She states understanding of care plan and is agreeable to admission for further work-up.  She nor her husband have any further questions at this time.  Note: Portions of this report may have been transcribed using voice recognition software. Every effort was made to ensure accuracy; however, inadvertent computerized transcription errors may still be present.    [name] 06/06/19 1732    Tammy Morrison, MD 06/06/19 2337

## 2019-06-06 NOTE — ED Notes (Signed)
Occult stool NEGATIVE

## 2019-06-06 NOTE — H&P (Signed)
History and Physical  Tammy Thompson [id number] DOB: 10/31/1955 DOA: 06/06/2019  Referring physician: Lily Kocher, PA-C, ED provider PCP: [facility], [facility]  Outpatient Specialists:   Patient Coming From: home  Chief Complaint: abdominal pain, vomiting  HPI: Tammy Thompson is a 63 y.o. female with a history of colon cancer, history of colon cancer with subtotal right hemicolectomy and ileocolic anastomosis, bladder surgery, diabetes, hypertension, hypothyroidism.  Patient seen for "abdominal spasms" since yesterday.  Over the past 24 hours she has had multiple episodes of bilious vomiting.  She has been intolerant of oral food and liquids.  No palliating or provoking factors.  She was seen in the [facility] hospital yesterday with a question of a pancreatic cyst, although this was not seen today.  Her symptoms are worsening.  Her abdominal pain is fairly generalized, although worse in the right upper, left upper, and epigastric areas.  Emergency Department Course: CT shows small bowel obstruction.  Her emesis is controlled without placement of NG tube  Review of Systems:   Pt denies any fevers, chills, diarrhea, constipation, shortness of breath, dyspnea on exertion, orthopnea, cough, wheezing, palpitations, headache, vision changes, lightheadedness, dizziness, melena, rectal bleeding.  Review of systems are otherwise negative  Past Medical History:  Diagnosis Date  . Cancer ([place])    colon  . Diabetes mellitus   . Hypertension   . Thyroid disease    Past Surgical History:  Procedure Laterality Date  . ABDOMINAL HYSTERECTOMY     Partial*  . BLADDER SURGERY    . COLON SURGERY    . THYROID SURGERY     Social History:  reports that she quit smoking about 13 years ago. She has never used smokeless tobacco. She reports previous drug use. She reports that she does not drink alcohol. Patient lives at home  No Known Allergies  Family History  Problem Relation Age of Onset  .  Hypertension Daughter       Prior to Admission medications   Medication Sig Start Date End Date Taking? Authorizing Provider  acyclovir (ZOVIRAX) 200 MG capsule Take 200 mg by mouth daily.   Yes [provider]  aspirin EC 81 MG tablet Take 81 mg by mouth daily.   Yes [provider]  BIOTIN PO Take 1 tablet by mouth daily.   Yes [provider]  Cholecalciferol (VITAMIN D) 125 MCG (5000 UT) CAPS Take 1 capsule by mouth daily.   Yes [provider]  gabapentin (NEURONTIN) 400 MG capsule Take 400 mg by mouth at bedtime.   Yes [provider]  glimepiride (AMARYL) 4 MG tablet Take 4 mg by mouth daily.   Yes [provider]  HYDROCHLOROTHIAZIDE PO Take 1 tablet by mouth daily.   Yes [provider]  levothyroxine (SYNTHROID, LEVOTHROID) 25 MCG tablet Take 25 mcg by mouth daily. NOT SURE OF THE DOSAGE    Yes [provider]  lisinopril (PRINIVIL,ZESTRIL) 40 MG tablet Take 40 mg by mouth daily.   Yes [provider]  metFORMIN (GLUCOPHAGE) 500 MG tablet Take 1,000 mg by mouth daily. Dose not know the DOSE    Yes [provider]  methocarbamol (ROBAXIN) 500 MG tablet Take 1 tablet (500 mg total) by mouth 2 (two) times daily. 10/01/18  Yes Caryl Ada K, PA-C  vitamin B-12 (CYANOCOBALAMIN) 100 MCG tablet Take 100 mcg by mouth daily.   Yes [provider]  cyclobenzaprine (FLEXERIL) 10 MG tablet Take 1 tablet (10 mg total) by mouth 3 (  three) times daily as needed for muscle spasms. Patient not taking: Reported on 06/06/2019 05/21/18   Jola Schmidt, MD  HYDROcodone-acetaminophen (NORCO/VICODIN) 5-325 MG tablet Take 2 tablets by mouth every 4 (four) hours as needed. Patient not taking: Reported on 06/06/2019 10/01/18   Fransico Meadow, PA-C  ibuprofen (ADVIL,MOTRIN) 600 MG tablet Take 1 tablet (600 mg total) by mouth every 8 (eight) hours as needed. Patient not taking: Reported on 06/06/2019 05/21/18   Jola Schmidt, MD  Multiple Vitamin (MULTIVITAMIN) tablet Take 1 tablet by mouth daily.    [provider]  predniSONE (DELTASONE) 10 MG tablet 6,5,4,3,2,1 taper Patient not taking: Reported on 06/06/2019 10/01/18   Fransico Meadow, PA-C    Physical Exam: BP (!) 156/86 (BP Location: Right Arm)   Pulse 85   Temp 99.2 F (37.3 C) (Oral)   Resp 14   Ht 5\' 3"  (1.6 m)   Wt 72.6 kg   SpO2 94%   BMI 28.34 kg/m   . General: Elderly female. Awake and alert and oriented x3. No acute cardiopulmonary distress.  Marland Kitchen HEENT: Normocephalic atraumatic.  Right and left ears normal in appearance.  Pupils equal, round, reactive to light. Extraocular muscles are intact. Sclerae anicteric and noninjected.  Moist mucosal membranes. No mucosal lesions.  . Neck: Neck supple without lymphadenopathy. No carotid bruits. No masses palpated.  . Cardiovascular: Regular rate with normal S1-S2 sounds. No murmurs, rubs, gallops auscultated. No JVD.  Marland Kitchen Respiratory: Good respiratory effort with no wheezes, rales, rhonchi. Lungs clear to auscultation bilaterally.  No accessory muscle use. . Abdomen: Soft, moderate tenderness in the epigastric and upper quadrants.  No rebound tenderness.  Mild guarding.  Absent bowel sounds. No masses or hepatosplenomegaly  . Skin: No rashes, lesions, or ulcerations.  Dry, warm to touch. 2+ dorsalis pedis and radial pulses. . Musculoskeletal: No calf or leg pain. All major joints not erythematous nontender.  No upper or lower joint deformation.  Good ROM.  No contractures  . Psychiatric: Intact judgment and insight. Pleasant and cooperative. . Neurologic: No focal neurological deficits. Strength is 5/5 and symmetric in upper and lower extremities.  Cranial nerves II through XII are grossly intact.           Labs on Admission: I have personally reviewed following labs and imaging studies  CBC: Recent Labs  Lab 06/06/19 0905  WBC 9.9  NEUTROABS 8.6*  HGB 13.9  HCT 44.9  MCV 95.3  PLT  [id number]   Basic Metabolic Panel: Recent Labs  Lab 06/06/19 0905  NA 140  K 4.7  CL 97*  CO2 31  GLUCOSE 288*  BUN 13  CREATININE 0.75  CALCIUM 10.0   GFR: Estimated Creatinine Clearance: 67.9 mL/min (by C-G formula based on SCr of 0.75 mg/dL). Liver Function Tests: Recent Labs  Lab 06/06/19 0905  AST 17  ALT 20  ALKPHOS 55  BILITOT 0.8  PROT 7.4  ALBUMIN 4.5   Recent Labs  Lab 06/06/19 0905  LIPASE 18   No results for input(s): AMMONIA in the last 168 hours. Coagulation Profile: No results for input(s): INR, PROTIME in the last 168 hours. Cardiac Enzymes: No results for input(s): CKTOTAL, CKMB, CKMBINDEX, TROPONINI in the last 168 hours. BNP (last 3 results) No results for input(s): PROBNP in the last 8760 hours. HbA1C: No results for input(s): HGBA1C in the last 72 hours. CBG: No results for input(s): GLUCAP in the last 168 hours. Lipid Profile: No results for input(s): CHOL, HDL,  LDLCALC, TRIG, CHOLHDL, LDLDIRECT in the last 72 hours. Thyroid Function Tests: No results for input(s): TSH, T4TOTAL, FREET4, T3FREE, THYROIDAB in the last 72 hours. Anemia Panel: No results for input(s): VITAMINB12, FOLATE, FERRITIN, TIBC, IRON, RETICCTPCT in the last 72 hours. Urine analysis:    Component Value Date/Time   COLORURINE YELLOW 06/06/2019 0750   APPEARANCEUR CLEAR 06/06/2019 0750   LABSPEC 1.026 06/06/2019 0750   PHURINE 6.0 06/06/2019 0750   GLUCOSEU >=500 (A) 06/06/2019 0750   HGBUR NEGATIVE 06/06/2019 0750   BILIRUBINUR NEGATIVE 06/06/2019 0750   KETONESUR 20 (A) 06/06/2019 0750   PROTEINUR NEGATIVE 06/06/2019 0750   NITRITE NEGATIVE 06/06/2019 0750   LEUKOCYTESUR NEGATIVE 06/06/2019 0750   Sepsis Labs: @LABRCNTIP (procalcitonin:4,lacticidven:4) )No results found for this or any previous visit (from the past 240 hour(s)).   Radiological Exams on Admission: US Abdomen Complete  Result Date: 06/06/2019 CLINICAL DATA:  Right upper quadrant epigastric and  left upper quadrant pain. EXAM: ABDOMEN ULTRASOUND COMPLETE COMPARISON:  02/14/2018 FINDINGS: Gallbladder: No gallstones or wall thickening visualized. No sonographic Murphy sign noted by sonographer. Common bile duct: Diameter: 6 mm, unchanged from prior study. Liver: Mildly heterogeneous echotexture with slight increased echogenicity relative to right kidney. Portal vein is patent on color Doppler imaging with normal direction of blood flow towards the liver. IVC: No abnormality visualized. Pancreas: Visualized portion unremarkable. Spleen: Size and appearance within normal limits. Right Kidney: Length: 10.2 cm. Echogenicity within normal limits. No mass or hydronephrosis visualized. Left Kidney: Length: 11.1 cm. Echogenicity within normal limits. No mass or hydronephrosis visualized. Abdominal aorta: No aneurysm visualized. Other findings: None. IMPRESSION: 1. Hepatic steatosis and mildly coarsened echotexture. Correlate with any clinical or laboratory evidence of liver disease. 2. No acute findings. Electronically Signed   By: Zetta Bills M.D.   On: 06/06/2019 10:11   Ct Abdomen Pelvis W Contrast  Result Date: 06/06/2019 CLINICAL DATA:  Right upper quadrant, epigastric, left upper quadrant and mid to lower abdominal pain since yesterday. Vomiting. History of colon cancer, chemotherapy, hysterectomy, colon and bladder surgery. EXAM: CT ABDOMEN AND PELVIS WITH CONTRAST TECHNIQUE: Multidetector CT imaging of the abdomen and pelvis was performed using the standard protocol following bolus administration of intravenous contrast. CONTRAST:  137mL OMNIPAQUE IOHEXOL 300 MG/ML  SOLN COMPARISON:  02/14/2018 abdominal sonogram. Outside CT abdomen/pelvis from 06/25/2017. FINDINGS: Lower chest: No significant pulmonary nodules or acute consolidative airspace disease. Hepatobiliary: Normal liver size. No liver mass. Normal gallbladder with no radiopaque cholelithiasis. No biliary ductal dilatation. Pancreas: Hypodense  0.6 cm anterior pancreatic head lesion (series 2/image 33), not definitely seen on prior CT. No additional pancreatic lesions. No pancreatic duct dilation. Spleen: Normal size. No mass. Adrenals/Urinary Tract: Normal adrenals. Normal kidneys with no hydronephrosis and no renal mass. Normal bladder. Stomach/Bowel: Small hiatal hernia. Otherwise normal nondistended stomach. There are postsurgical changes from subtotal right hemicolectomy with ileocolic anastomosis in the right abdomen. There are several mildly dilated small bowel loops throughout the mid to distal small bowel measuring up to 3.7 cm diameter. Mild small bowel wall thickening within a few small bowel loops in the right lower abdomen. The distal small bowel is collapsed. There is a suggestion of a focal small bowel caliber transition in the right lower quadrant (series 2/image 65), without appreciable obstructing mass or associated hernia. Findings are compatible with mechanical mid to distal small bowel obstruction. No definite pneumatosis. Remnant large bowel is relatively collapsed with no diverticulosis or definite large bowel wall thickening. Vascular/Lymphatic: Atherosclerotic nonaneurysmal abdominal aorta.  Patent portal, splenic, hepatic and renal veins. No pathologically enlarged lymph nodes in the abdomen or pelvis. Reproductive: Status post hysterectomy, with no abnormal findings at the vaginal cuff. No adnexal mass. Other: No pneumoperitoneum. Trace free fluid in the pelvis and right pericolic gutter. No focal fluid collection. Musculoskeletal: No aggressive appearing focal osseous lesions. Mild thoracolumbar spondylosis. IMPRESSION: 1. CT findings are compatible with mechanical small-bowel obstruction in the mid to distal small bowel, with suggestion of transition point in the right lower quadrant. Favor adhesions as the cause given postsurgical changes from subtotal right hemicolectomy with ileocolic anastomosis in the right abdomen. 2.  Nonspecific mild wall thickening within a few of the mildly dilated small bowel loops in the right abdomen. No definite pneumatosis. No pneumoperitoneum. Trace free fluid in the pelvis and right abdomen. 3. No lymphadenopathy or other findings of metastatic disease in the abdomen or pelvis. 4. Subcentimeter low-attenuation anterior pancreatic head lesion, not definitely seen on 06/25/2017 outside CT abdomen study. No biliary or pancreatic duct dilation. Suggest short-term outpatient follow-up MRI abdomen without and with IV contrast for further characterization. 5. Small hiatal hernia. 6.  Aortic Atherosclerosis (ICD10-I70.0). Electronically Signed   By: Ilona Sorrel M.D.   On: 06/06/2019 15:45   Dg Chest Portable 1 View  Result Date: 06/06/2019 CLINICAL DATA:  63 year old female with right upper quadrant and epigastric pain EXAM: PORTABLE CHEST 1 VIEW COMPARISON:  05/21/2018 FINDINGS: Cardiomediastinal silhouette unchanged in size and contour. No evidence of central vascular congestion. No pneumothorax or pleural effusion. Similar appearance of coarsened interstitial markings. IMPRESSION: Negative for acute cardiopulmonary disease Electronically Signed   By: Corrie Mckusick D.O.   On: 06/06/2019 08:52    EKG: Independently reviewed.  Sinus rhythm.  No acute ST changes.  Assessment/Plan: Principal Problem:   SBO (small bowel obstruction) ([facility]) Active Problems:   Type 2 diabetes mellitus without complication (H[facility])   Cancer ([place])   Essential hypertension   Small bowel obstruction ([place])    This patient was discussed with the ED physician, including pertinent vitals, physical exam findings, labs, and imaging.  We also discussed care given by the ED provider.  1. Small bowel obstruction a. Admit b. No evidence of perforation c. N.p.o. d. General surgery to consult in the morning e. NG tube if becomes symptomatic with vomiting f. Pain control g. Antiemetics h. Gastric protection with H2 blocker  2. Type 2 diabetes a. CBGs every 6 hours with sliding scale insulin 3. Colon cancer status post subtotal right hemicolectomy  a.  4. Hypertension a. Continue home regimen   DVT prophylaxis: SCDs Consultants: General surgery Code Status: Full code Family Communication: None Disposition Plan: Patient should be able to return home following resolution of SBO   Truett Mainland, DO

## 2019-06-07 ENCOUNTER — Other Ambulatory Visit: Payer: Self-pay

## 2019-06-07 DIAGNOSIS — K56609 Unspecified intestinal obstruction, unspecified as to partial versus complete obstruction: Secondary | ICD-10-CM

## 2019-06-07 LAB — GLUCOSE, CAPILLARY
Glucose-Capillary: 136 mg/dL — ABNORMAL HIGH (ref 70–99)
Glucose-Capillary: 129 mg/dL — ABNORMAL HIGH (ref 70–99)
Glucose-Capillary: 113 mg/dL — ABNORMAL HIGH (ref 70–99)
Glucose-Capillary: 156 mg/dL — ABNORMAL HIGH (ref 70–99)

## 2019-06-07 LAB — BASIC METABOLIC PANEL
Anion gap: 9 (ref 5–15)
BUN: 10 mg/dL (ref 8–23)
CO2: 27 mmol/L (ref 22–32)
Calcium: 8.8 mg/dL — ABNORMAL LOW (ref 8.9–10.3)
Chloride: 106 mmol/L (ref 98–111)
Creatinine, Ser: 0.66 mg/dL (ref 0.44–1.00)
GFR calc Af Amer: >60 mL/min (ref >60)
GFR calc non Af Amer: >60 mL/min (ref >60)
Glucose, Bld: 144 mg/dL — ABNORMAL HIGH (ref 70–99)
Potassium: 3.6 mmol/L (ref 3.5–5.1)
Sodium: 142 mmol/L (ref 135–145)

## 2019-06-07 LAB — HIV ANTIBODY (ROUTINE TESTING W REFLEX): HIV Screen 4th Generation wRfx: NONREACTIVE

## 2019-06-07 LAB — CBC
HCT: 39.9 % (ref 36.0–46.0)
Hemoglobin: 12.2 g/dL (ref 12.0–15.0)
MCH: 29.3 pg (ref 26.0–34.0)
MCHC: 30.6 g/dL (ref 30.0–36.0)
MCV: 95.9 fL (ref 80.0–100.0)
Platelets: 201 10*3/uL (ref 150–400)
RBC: 4.16 MIL/uL (ref 3.87–5.11)
RDW: 12.6 % (ref 11.5–15.5)
WBC: 6.1 10*3/uL (ref 4.0–10.5)
nRBC: 0 % (ref 0.0–0.2)

## 2019-06-07 LAB — CBG MONITORING, ED: Glucose-Capillary: 176 mg/dL — ABNORMAL HIGH (ref 70–99)

## 2019-06-07 LAB — MRSA PCR SCREENING: MRSA by PCR: NEGATIVE

## 2019-06-07 LAB — SARS CORONAVIRUS 2 (TAT 6-24 HRS): SARS Coronavirus 2: NEGATIVE

## 2019-06-07 MED ORDER — BISACODYL 10 MG RE SUPP
10.0000 mg | Freq: Once | RECTAL | Status: AC
  Administered 2019-06-07: 10 mg via RECTAL
  Filled 2019-06-07: qty 1

## 2019-06-07 MED ORDER — INSULIN ASPART 100 UNIT/ML ~~LOC~~ SOLN
0.0000 [IU] | Freq: Three times a day (TID) | SUBCUTANEOUS | Status: DC
  Administered 2019-06-08: 2 [IU] via SUBCUTANEOUS
  Administered 2019-06-08 (×2): 1 [IU] via SUBCUTANEOUS
  Administered 2019-06-09: 2 [IU] via SUBCUTANEOUS
  Administered 2019-06-09: 1 [IU] via SUBCUTANEOUS
  Administered 2019-06-09: 2 [IU] via SUBCUTANEOUS
  Administered 2019-06-11: 1 [IU] via SUBCUTANEOUS
  Administered 2019-06-12: 2 [IU] via SUBCUTANEOUS

## 2019-06-07 MED ORDER — MORPHINE SULFATE (PF) 2 MG/ML IV SOLN
2.0000 mg | INTRAVENOUS | Status: AC | PRN
  Administered 2019-06-08 (×3): 2 mg via INTRAVENOUS
  Filled 2019-06-07 (×3): qty 1

## 2019-06-07 MED ORDER — CHLORHEXIDINE GLUCONATE CLOTH 2 % EX PADS
6.0000 | MEDICATED_PAD | Freq: Every day | CUTANEOUS | Status: DC
  Administered 2019-06-07 – 2019-06-12 (×6): 6 via TOPICAL

## 2019-06-07 MED ORDER — HEPARIN SODIUM (PORCINE) 5000 UNIT/ML IJ SOLN
5000.0000 [IU] | Freq: Three times a day (TID) | INTRAMUSCULAR | Status: DC
  Administered 2019-06-07 – 2019-06-12 (×13): 5000 [IU] via SUBCUTANEOUS
  Filled 2019-06-07 (×13): qty 1

## 2019-06-07 MED ORDER — LABETALOL HCL 5 MG/ML IV SOLN
10.0000 mg | INTRAVENOUS | Status: DC | PRN
  Administered 2019-06-09 – 2019-06-12 (×4): 10 mg via INTRAVENOUS
  Filled 2019-06-07 (×4): qty 4

## 2019-06-07 MED ORDER — INSULIN ASPART 100 UNIT/ML ~~LOC~~ SOLN: 0.0000 [IU] | Freq: Every day | SUBCUTANEOUS | Status: DC

## 2019-06-07 NOTE — Consult Note (Signed)
Reason for Consult: Small bowel obstruction Referring Physician: Dr. Milinda Hirschfeld is an 63 y.o. female.  HPI: Patient is a 63 year old black female who presented to the emergency room with worsening abdominal distention, nausea, and episodes of bilious vomiting.  She states that this is the first time she has had an episode like this which required hospitalization.  She is status post right hemicolectomy in the remote past for colon cancer.  A CT scan of the abdomen reveals a bowel obstruction with possible transition zone in the distal small bowel.  Patient states she usually has a bowel movement daily.  Her last bowel movement was 2 days ago.  She intermittently passes flatus.  Currently, she states her abdominal pain is minimal.  When she does have abdominal pain, it is crampy in nature.  Her nausea is variable.  Past Medical History:  Diagnosis Date  . Cancer ([place])    colon  . Diabetes mellitus   . Hypertension   . Thyroid disease     Past Surgical History:  Procedure Laterality Date  . ABDOMINAL HYSTERECTOMY     Partial*  . BLADDER SURGERY    . COLON SURGERY    . THYROID SURGERY      Family History  Problem Relation Age of Onset  . Hypertension Daughter     Social History:  reports that she quit smoking about 13 years ago. She has never used smokeless tobacco. She reports previous drug use. She reports that she does not drink alcohol.  Allergies: No Known Allergies  Medications:  I have reviewed the patient's current medications. Prior to Admission:  Medications Prior to Admission  Medication Sig Dispense Refill Last Dose  . acyclovir (ZOVIRAX) 200 MG capsule Take 200 mg by mouth daily.   06/05/2019 at Unknown time  . aspirin EC 81 MG tablet Take 81 mg by mouth daily.   Past Month at Unknown time  . BIOTIN PO Take 1 tablet by mouth daily.   06/05/2019 at Unknown time  . Cholecalciferol (VITAMIN D) 125 MCG (5000 UT) CAPS Take 1 capsule by mouth daily.   06/05/2019  at Unknown time  . gabapentin (NEURONTIN) 400 MG capsule Take 400 mg by mouth at bedtime.   06/05/2019 at Unknown time  . glimepiride (AMARYL) 4 MG tablet Take 4 mg by mouth daily.   06/05/2019 at Unknown time  . HYDROCHLOROTHIAZIDE PO Take 1 tablet by mouth daily.   06/05/2019 at Unknown time  . levothyroxine (SYNTHROID, LEVOTHROID) 25 MCG tablet Take 25 mcg by mouth daily. NOT SURE OF THE DOSAGE    06/05/2019 at Unknown time  . lisinopril (PRINIVIL,ZESTRIL) 40 MG tablet Take 40 mg by mouth daily.   06/05/2019 at Unknown time  . metFORMIN (GLUCOPHAGE) 500 MG tablet Take 1,000 mg by mouth daily. Dose not know the DOSE    06/05/2019 at Unknown time  . methocarbamol (ROBAXIN) 500 MG tablet Take 1 tablet (500 mg total) by mouth 2 (two) times daily. 20 tablet 0   . vitamin B-12 (CYANOCOBALAMIN) 100 MCG tablet Take 100 mcg by mouth daily.   06/05/2019 at Unknown time  . cyclobenzaprine (FLEXERIL) 10 MG tablet Take 1 tablet (10 mg total) by mouth 3 (three) times daily as needed for muscle spasms. (Patient not taking: Reported on 06/06/2019) 12 tablet 0 Not Taking at Unknown time  . HYDROcodone-acetaminophen (NORCO/VICODIN) 5-325 MG tablet Take 2 tablets by mouth every 4 (four) hours as needed. (Patient not taking: Reported on 06/06/2019) 10  tablet 0 Not Taking at Unknown time  . ibuprofen (ADVIL,MOTRIN) 600 MG tablet Take 1 tablet (600 mg total) by mouth every 8 (eight) hours as needed. (Patient not taking: Reported on 06/06/2019) 15 tablet 0 Not Taking at Unknown time  . Multiple Vitamin (MULTIVITAMIN) tablet Take 1 tablet by mouth daily.     . predniSONE (DELTASONE) 10 MG tablet 6,5,4,3,2,1 taper (Patient not taking: Reported on 06/06/2019) 21 tablet 0 Completed Course at Unknown time    Results for orders placed or performed during the hospital encounter of 06/06/19 (from the past 48 hour(s))  Urinalysis, Routine w reflex microscopic     Status: Abnormal   Collection Time: 06/06/19  7:50 AM  Result Value Ref  Range   Color, Urine YELLOW YELLOW   APPearance CLEAR CLEAR   Specific Gravity, Urine 1.026 1.005 - 1.030   pH 6.0 5.0 - 8.0   Glucose, UA >=500 (A) NEGATIVE mg/dL   Hgb urine dipstick NEGATIVE NEGATIVE   Bilirubin Urine NEGATIVE NEGATIVE   Ketones, ur 20 (A) NEGATIVE mg/dL   Protein, ur NEGATIVE NEGATIVE mg/dL   Nitrite NEGATIVE NEGATIVE   Leukocytes,Ua NEGATIVE NEGATIVE   RBC / HPF 0-5 0 - 5 RBC/hpf   WBC, UA 0-5 0 - 5 WBC/hpf   Bacteria, UA NONE SEEN NONE SEEN   Squamous Epithelial / LPF 0-5 0 - 5   Mucus PRESENT     Comment: Performed at [facility], [address] [place], [place] [address]  Comprehensive metabolic panel     Status: Abnormal   Collection Time: 06/06/19  9:05 AM  Result Value Ref Range   Sodium 140 135 - 145 mmol/L   Potassium 4.7 3.5 - 5.1 mmol/L   Chloride 97 (L) 98 - 111 mmol/L   CO2 31 22 - 32 mmol/L   Glucose, Bld 288 (H) 70 - 99 mg/dL   BUN 13 8 - 23 mg/dL   Creatinine, Ser 0.75 0.44 - 1.00 mg/dL   Calcium 10.0 8.9 - 10.3 mg/dL   Total Protein 7.4 6.5 - 8.1 g/dL   Albumin 4.5 3.5 - 5.0 g/dL   AST 17 15 - 41 U/L   ALT 20 0 - 44 U/L   Alkaline Phosphatase 55 38 - 126 U/L   Total Bilirubin 0.8 0.3 - 1.2 mg/dL   GFR calc non Af Amer >60 >60 mL/min   GFR calc Af Amer >60 >60 mL/min   Anion gap 12 5 - 15    Comment: Performed at [facility], [address], [place], [place] [address]  Lipase, blood     Status: None   Collection Time: 06/06/19  9:05 AM  Result Value Ref Range   Lipase 18 11 - 51 U/L    Comment: Performed at [facility], [address], [place], [place] [address]  CBC with Diff     Status: Abnormal   Collection Time: 06/06/19  9:05 AM  Result Value Ref Range   WBC 9.9 4.0 - 10.5 K/uL   RBC 4.71 3.87 - 5.11 MIL/uL   Hemoglobin 13.9 12.0 - 15.0 g/dL   HCT 44.9 36.0 - 46.0 %   MCV 95.3 80.0 - 100.0 fL   MCH 29.5 26.0 - 34.0 pg   MCHC 31.0 30.0 - 36.0 g/dL   RDW 12.4 11.5 - 15.5 %   Platelets 229 150 - 400 K/uL   nRBC 0.0 0.0 -  0.2 %   Neutrophils Relative % 88 %   Neutro Abs  8.6 (H) 1.7 - 7.7 K/uL   Lymphocytes Relative 9 %   Lymphs Abs 0.9 0.7 - 4.0 K/uL   Monocytes Relative 3 %   Monocytes Absolute 0.3 0.1 - 1.0 K/uL   Eosinophils Relative 0 %   Eosinophils Absolute 0.0 0.0 - 0.5 K/uL   Basophils Relative 0 %   Basophils Absolute 0.0 0.0 - 0.1 K/uL   Immature Granulocytes 0 %   Abs Immature Granulocytes 0.03 0.00 - 0.07 K/uL    Comment: Performed at [facility], [address], [place], [place] [address]  POC occult blood, ED     Status: None   Collection Time: 06/06/19 11:53 AM  Result Value Ref Range   Fecal Occult Bld NEGATIVE NEGATIVE  CBG monitoring, ED     Status: Abnormal   Collection Time: 06/06/19  8:25 PM  Result Value Ref Range   Glucose-Capillary 188 (H) 70 - 99 mg/dL  CBG monitoring, ED     Status: Abnormal   Collection Time: 06/06/19 10:08 PM  Result Value Ref Range   Glucose-Capillary 169 (H) 70 - 99 mg/dL  CBG monitoring, ED     Status: Abnormal   Collection Time: 06/07/19  2:44 AM  Result Value Ref Range   Glucose-Capillary 176 (H) 70 - 99 mg/dL  CBC     Status: None   Collection Time: 06/07/19  5:46 AM  Result Value Ref Range   WBC 6.1 4.0 - 10.5 K/uL   RBC 4.16 3.87 - 5.11 MIL/uL   Hemoglobin 12.2 12.0 - 15.0 g/dL   HCT 39.9 36.0 - 46.0 %   MCV 95.9 80.0 - 100.0 fL   MCH 29.3 26.0 - 34.0 pg   MCHC 30.6 30.0 - 36.0 g/dL   RDW 12.6 11.5 - 15.5 %   Platelets 201 150 - 400 K/uL   nRBC 0.0 0.0 - 0.2 %    Comment: Performed at [facility], [address], [place], [place] [id number]  Basic metabolic panel     Status: Abnormal   Collection Time: 06/07/19  5:46 AM  Result Value Ref Range   Sodium 142 135 - 145 mmol/L   Potassium 3.6 3.5 - 5.1 mmol/L    Comment: DELTA CHECK NOTED   Chloride 106 98 - 111 mmol/L   CO2 27 22 - 32 mmol/L   Glucose, Bld 144 (H) 70 - 99 mg/dL   BUN 10 8 - 23 mg/dL   Creatinine, Ser 0.66 0.44 - 1.00 mg/dL   Calcium 8.8 (L) 8.9 - 10.3 mg/dL   GFR  calc non Af Amer >60 >60 mL/min   GFR calc Af Amer >60 >60 mL/min   Anion gap 9 5 - 15    Comment: Performed at [facility], [address] [place], [place] [address]  Glucose, capillary     Status: Abnormal   Collection Time: 06/07/19  8:30 AM  Result Value Ref Range   Glucose-Capillary 136 (H) 70 - 99 mg/dL    US Abdomen Complete  Result Date: 06/06/2019 CLINICAL DATA:  Right upper quadrant epigastric and left upper quadrant pain. EXAM: ABDOMEN ULTRASOUND COMPLETE COMPARISON:  02/14/2018 FINDINGS: Gallbladder: No gallstones or wall thickening visualized. No sonographic Murphy sign noted by sonographer. Common bile duct: Diameter: 6 mm, unchanged from prior study. Liver: Mildly heterogeneous echotexture with slight increased echogenicity relative to right kidney. Portal vein is patent on color Doppler imaging with normal direction of blood flow towards the liver. IVC: No abnormality visualized. Pancreas: Visualized portion  unremarkable. Spleen: Size and appearance within normal limits. Right Kidney: Length: 10.2 cm. Echogenicity within normal limits. No mass or hydronephrosis visualized. Left Kidney: Length: 11.1 cm. Echogenicity within normal limits. No mass or hydronephrosis visualized. Abdominal aorta: No aneurysm visualized. Other findings: None. IMPRESSION: 1. Hepatic steatosis and mildly coarsened echotexture. Correlate with any clinical or laboratory evidence of liver disease. 2. No acute findings. Electronically Signed   By: Zetta Bills M.D.   On: 06/06/2019 10:11   Ct Abdomen Pelvis W Contrast  Result Date: 06/06/2019 CLINICAL DATA:  Right upper quadrant, epigastric, left upper quadrant and mid to lower abdominal pain since yesterday. Vomiting. History of colon cancer, chemotherapy, hysterectomy, colon and bladder surgery. EXAM: CT ABDOMEN AND PELVIS WITH CONTRAST TECHNIQUE: Multidetector CT imaging of the abdomen and pelvis was performed using the standard protocol following bolus  administration of intravenous contrast. CONTRAST:  145mL OMNIPAQUE IOHEXOL 300 MG/ML  SOLN COMPARISON:  02/14/2018 abdominal sonogram. Outside CT abdomen/pelvis from 06/25/2017. FINDINGS: Lower chest: No significant pulmonary nodules or acute consolidative airspace disease. Hepatobiliary: Normal liver size. No liver mass. Normal gallbladder with no radiopaque cholelithiasis. No biliary ductal dilatation. Pancreas: Hypodense 0.6 cm anterior pancreatic head lesion (series 2/image 33), not definitely seen on prior CT. No additional pancreatic lesions. No pancreatic duct dilation. Spleen: Normal size. No mass. Adrenals/Urinary Tract: Normal adrenals. Normal kidneys with no hydronephrosis and no renal mass. Normal bladder. Stomach/Bowel: Small hiatal hernia. Otherwise normal nondistended stomach. There are postsurgical changes from subtotal right hemicolectomy with ileocolic anastomosis in the right abdomen. There are several mildly dilated small bowel loops throughout the mid to distal small bowel measuring up to 3.7 cm diameter. Mild small bowel wall thickening within a few small bowel loops in the right lower abdomen. The distal small bowel is collapsed. There is a suggestion of a focal small bowel caliber transition in the right lower quadrant (series 2/image 65), without appreciable obstructing mass or associated hernia. Findings are compatible with mechanical mid to distal small bowel obstruction. No definite pneumatosis. Remnant large bowel is relatively collapsed with no diverticulosis or definite large bowel wall thickening. Vascular/Lymphatic: Atherosclerotic nonaneurysmal abdominal aorta. Patent portal, splenic, hepatic and renal veins. No pathologically enlarged lymph nodes in the abdomen or pelvis. Reproductive: Status post hysterectomy, with no abnormal findings at the vaginal cuff. No adnexal mass. Other: No pneumoperitoneum. Trace free fluid in the pelvis and right pericolic gutter. No focal fluid  collection. Musculoskeletal: No aggressive appearing focal osseous lesions. Mild thoracolumbar spondylosis. IMPRESSION: 1. CT findings are compatible with mechanical small-bowel obstruction in the mid to distal small bowel, with suggestion of transition point in the right lower quadrant. Favor adhesions as the cause given postsurgical changes from subtotal right hemicolectomy with ileocolic anastomosis in the right abdomen. 2. Nonspecific mild wall thickening within a few of the mildly dilated small bowel loops in the right abdomen. No definite pneumatosis. No pneumoperitoneum. Trace free fluid in the pelvis and right abdomen. 3. No lymphadenopathy or other findings of metastatic disease in the abdomen or pelvis. 4. Subcentimeter low-attenuation anterior pancreatic head lesion, not definitely seen on 06/25/2017 outside CT abdomen study. No biliary or pancreatic duct dilation. Suggest short-term outpatient follow-up MRI abdomen without and with IV contrast for further characterization. 5. Small hiatal hernia. 6.  Aortic Atherosclerosis (ICD10-I70.0). Electronically Signed   By: Ilona Sorrel M.D.   On: 06/06/2019 15:45   Dg Chest Portable 1 View  Result Date: 06/06/2019 CLINICAL DATA:  63 year old female with right upper quadrant  and epigastric pain EXAM: PORTABLE CHEST 1 VIEW COMPARISON:  05/21/2018 FINDINGS: Cardiomediastinal silhouette unchanged in size and contour. No evidence of central vascular congestion. No pneumothorax or pleural effusion. Similar appearance of coarsened interstitial markings. IMPRESSION: Negative for acute cardiopulmonary disease Electronically Signed   By: Corrie Mckusick D.O.   On: 06/06/2019 08:52    ROS:  Pertinent items are noted in HPI.  Blood pressure 132/72, pulse 88, temperature 98.8 F (37.1 C), temperature source Oral, resp. rate 20, height 5\' 3"  (1.6 m), weight 73.5 kg, SpO2 97 %. Physical Exam: Pleasant black female no acute distress Head is normocephalic,  atraumatic Lungs are clear to auscultation with good breath sounds bilaterally Heart examination reveals regular rate and rhythm without S3, S4, murmurs Abdomen is slightly distended with minimal bowel sounds appreciated.  No specific point tenderness is noted.  No hernias are noted.  No rigidity is noted.  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  There is no evidence at the present time but she has recurrent colon cancer. Plan: Suggest bowel rest at this point.  May need NG tube should she develop severe episodes of emesis.  Will continue to watch with you.  Aviva Signs 06/07/2019, 11:13 AM

## 2019-06-07 NOTE — Progress Notes (Signed)
Patient Demographics:    Tammy Thompson, is a 63 y.o. female, DOB - 17-Jul-1956, [id number]  Admit date - 06/06/2019   Admitting Physician Truett Mainland, DO  Outpatient Primary MD for the patient is [facility]  LOS - 1  Chief Complaint  Patient presents with   Abdominal Pain        Subjective:    Tammy Thompson today has no fevers,  No chest pain,  Has nausea, no emesis abd pain persist No flatus , no BM  Assessment  & Plan :    Principal Problem:   SBO (small bowel obstruction) ([place]) Active Problems:   Type 2 diabetes mellitus without complication ([place])   Cancer ([place])   Essential hypertension   Small bowel obstruction ([place])  Brief Summary 63 y.o. female with a history of colon cancer, history of colon cancer with subtotal right hemicolectomy and ileocolic anastomosis, bladder surgery, diabetes, hypertension, hypothyroidism admitted with abdominal pain, emesis and bowel obstruction presumed secondary to adhesions   A/p 1)SBO-secondary to presumed adhesive process--surgical consult appreciated -Give Dulcolax suppository -Give NG tube if emesis -As needed IV Toradol or IV morphine sulfate -As needed Zofran NPO-  2)DM2-no recent A1c available, hold Amaryl, hold metformin while patient is n.p.o. Allow some permissive Hyperglycemia rather than risk life-threatening hypoglycemia in a patient with unreliable oral intake. Use Novolog/Humalog Sliding scale insulin with Accu-Cheks/Fingersticks as ordered  3)HTN--  may use IV labetalol when necessary  Every 4 hours for systolic blood pressure over 160 mmhg  Disposition/Need for in-Hospital Stay- patient unable to be discharged at this time due to *  Code Status : Full  Family Communication:   NA (patient is alert, awake and coherent)   Disposition Plan  : TBD  Consults  :  Gen surg  DVT Prophylaxis  :  - Heparin - SCDs    Lab Results  Component Value Date   PLT 201 06/07/2019    Inpatient Medications  Scheduled Meds:  aspirin EC  81 mg Oral Daily   bisacodyl  10 mg Rectal Once   Chlorhexidine Gluconate Cloth  6 each Topical Daily   gabapentin  400 mg Oral QHS   insulin aspart  0-15 Units Subcutaneous Q6H   levothyroxine  25 mcg Oral Q0600   Continuous Infusions:  sodium chloride Stopped (06/06/19 1720)   sodium chloride 100 mL/hr at 06/07/19 0745   famotidine (PEPCID) IV 20 mg (06/07/19 0845)   PRN Meds:.ketorolac, labetalol, morphine injection, ondansetron **OR** ondansetron (ZOFRAN) IV    Anti-infectives (From admission, onward)   None        Objective:   Vitals:   06/07/19 0300 06/07/19 0703 06/07/19 1536 06/07/19 1705  BP: (!) 159/96 132/72  (!) 157/90  Pulse: 82 88 86 95  Resp: 16 20 (!) 21 20  Temp:   97.8 F (36.6 C)   TempSrc:   Oral   SpO2: 97% 97% 98% 95%  Weight:  73.5 kg    Height:  5\' 3"  (1.6 m)      Wt Readings from Last 3 Encounters:  06/07/19 73.5 kg  10/01/18 71.7 kg  09/27/18 71.7 kg     Intake/Output Summary (Last 24 hours) at 06/07/2019 1814 Last data filed at 06/07/2019  1700 Gross per 24 hour  Intake 1837.5 ml  Output --  Net 1837.5 ml     Physical Exam  Gen:- Awake Alert,  In no apparent distress  HEENT:- [place].AT, No sclera icterus Neck-Supple Neck,No JVD,.  Lungs-  CTAB , fair symmetrical air movement CV- S1, S2 normal, regular  Abd-diminished bowel sounds, generalized abdominal tenderness without rebound or guarding, not very distended, scar from prior laparotomy noted Extremity/Skin:- No  edema, pedal pulses present  Psych-affect is appropriate, oriented x3 Neuro-no new focal deficits, no tremors   Data Review:   Micro Results Recent Results (from the past 240 hour(s))  SARS CORONAVIRUS 2 (TAT 6-24 HRS) Nasopharyngeal Nasopharyngeal Swab     Status: None   Collection Time: 06/06/19  9:17 PM   Specimen: Nasopharyngeal Swab   Result Value Ref Range Status   SARS Coronavirus 2 NEGATIVE NEGATIVE Final    Comment: (NOTE) SARS-CoV-2 target nucleic acids are NOT DETECTED. The SARS-CoV-2 RNA is generally detectable in upper and lower respiratory specimens during the acute phase of infection. Negative results do not preclude SARS-CoV-2 infection, do not rule out co-infections with other pathogens, and should not be used as the sole basis for treatment or other patient management decisions. Negative results must be combined with clinical observations, patient history, and epidemiological information. The expected result is Negative. Fact Sheet for Patients: SugarRoll.be Fact Sheet for Healthcare Providers: https://www.woods-mathews.com/ This test is not yet approved or cleared by the [place] FDA and  has been authorized for detection and/or diagnosis of SARS-CoV-2 by FDA under an Emergency Use Authorization (EUA). This EUA will remain  in effect (meaning this test can be used) for the duration of the COVID-19 declaration under Section 56 4(b)(1) of the Act, 21 U.S.C. section 360bbb-3(b)(1), unless the authorization is terminated or revoked sooner. Performed at [facility], [place] [address] [place], [place] [address]   MRSA PCR Screening     Status: None   Collection Time: 06/07/19  7:54 AM   Specimen: Nasal Mucosa; Nasopharyngeal  Result Value Ref Range Status   MRSA by PCR NEGATIVE NEGATIVE Final    Comment:        The GeneXpert MRSA Assay (FDA approved for NASAL specimens only), is one component of a comprehensive MRSA colonization surveillance program. It is not intended to diagnose MRSA infection nor to guide or monitor treatment for MRSA infections. Performed at [facility], [address] [place], [place] [address]     Radiology Reports US Abdomen Complete  Result Date: 06/06/2019 CLINICAL DATA:  Right upper quadrant epigastric and left  upper quadrant pain. EXAM: ABDOMEN ULTRASOUND COMPLETE COMPARISON:  02/14/2018 FINDINGS: Gallbladder: No gallstones or wall thickening visualized. No sonographic Murphy sign noted by sonographer. Common bile duct: Diameter: 6 mm, unchanged from prior study. Liver: Mildly heterogeneous echotexture with slight increased echogenicity relative to right kidney. Portal vein is patent on color Doppler imaging with normal direction of blood flow towards the liver. IVC: No abnormality visualized. Pancreas: Visualized portion unremarkable. Spleen: Size and appearance within normal limits. Right Kidney: Length: 10.2 cm. Echogenicity within normal limits. No mass or hydronephrosis visualized. Left Kidney: Length: 11.1 cm. Echogenicity within normal limits. No mass or hydronephrosis visualized. Abdominal aorta: No aneurysm visualized. Other findings: None. IMPRESSION: 1. Hepatic steatosis and mildly coarsened echotexture. Correlate with any clinical or laboratory evidence of liver disease. 2. No acute findings. Electronically Signed   By: Zetta Bills M.D.   On: 06/06/2019 10:11   Ct  Abdomen Pelvis W Contrast  Result Date: 06/06/2019 CLINICAL DATA:  Right upper quadrant, epigastric, left upper quadrant and mid to lower abdominal pain since yesterday. Vomiting. History of colon cancer, chemotherapy, hysterectomy, colon and bladder surgery. EXAM: CT ABDOMEN AND PELVIS WITH CONTRAST TECHNIQUE: Multidetector CT imaging of the abdomen and pelvis was performed using the standard protocol following bolus administration of intravenous contrast. CONTRAST:  153mL OMNIPAQUE IOHEXOL 300 MG/ML  SOLN COMPARISON:  02/14/2018 abdominal sonogram. Outside CT abdomen/pelvis from 06/25/2017. FINDINGS: Lower chest: No significant pulmonary nodules or acute consolidative airspace disease. Hepatobiliary: Normal liver size. No liver mass. Normal gallbladder with no radiopaque cholelithiasis. No biliary ductal dilatation. Pancreas: Hypodense 0.6  cm anterior pancreatic head lesion (series 2/image 33), not definitely seen on prior CT. No additional pancreatic lesions. No pancreatic duct dilation. Spleen: Normal size. No mass. Adrenals/Urinary Tract: Normal adrenals. Normal kidneys with no hydronephrosis and no renal mass. Normal bladder. Stomach/Bowel: Small hiatal hernia. Otherwise normal nondistended stomach. There are postsurgical changes from subtotal right hemicolectomy with ileocolic anastomosis in the right abdomen. There are several mildly dilated small bowel loops throughout the mid to distal small bowel measuring up to 3.7 cm diameter. Mild small bowel wall thickening within a few small bowel loops in the right lower abdomen. The distal small bowel is collapsed. There is a suggestion of a focal small bowel caliber transition in the right lower quadrant (series 2/image 65), without appreciable obstructing mass or associated hernia. Findings are compatible with mechanical mid to distal small bowel obstruction. No definite pneumatosis. Remnant large bowel is relatively collapsed with no diverticulosis or definite large bowel wall thickening. Vascular/Lymphatic: Atherosclerotic nonaneurysmal abdominal aorta. Patent portal, splenic, hepatic and renal veins. No pathologically enlarged lymph nodes in the abdomen or pelvis. Reproductive: Status post hysterectomy, with no abnormal findings at the vaginal cuff. No adnexal mass. Other: No pneumoperitoneum. Trace free fluid in the pelvis and right pericolic gutter. No focal fluid collection. Musculoskeletal: No aggressive appearing focal osseous lesions. Mild thoracolumbar spondylosis. IMPRESSION: 1. CT findings are compatible with mechanical small-bowel obstruction in the mid to distal small bowel, with suggestion of transition point in the right lower quadrant. Favor adhesions as the cause given postsurgical changes from subtotal right hemicolectomy with ileocolic anastomosis in the right abdomen. 2.  Nonspecific mild wall thickening within a few of the mildly dilated small bowel loops in the right abdomen. No definite pneumatosis. No pneumoperitoneum. Trace free fluid in the pelvis and right abdomen. 3. No lymphadenopathy or other findings of metastatic disease in the abdomen or pelvis. 4. Subcentimeter low-attenuation anterior pancreatic head lesion, not definitely seen on 06/25/2017 outside CT abdomen study. No biliary or pancreatic duct dilation. Suggest short-term outpatient follow-up MRI abdomen without and with IV contrast for further characterization. 5. Small hiatal hernia. 6.  Aortic Atherosclerosis (ICD10-I70.0). Electronically Signed   By: Ilona Sorrel M.D.   On: 06/06/2019 15:45   Dg Chest Portable 1 View  Result Date: 06/06/2019 CLINICAL DATA:  63 year old female with right upper quadrant and epigastric pain EXAM: PORTABLE CHEST 1 VIEW COMPARISON:  05/21/2018 FINDINGS: Cardiomediastinal silhouette unchanged in size and contour. No evidence of central vascular congestion. No pneumothorax or pleural effusion. Similar appearance of coarsened interstitial markings. IMPRESSION: Negative for acute cardiopulmonary disease Electronically Signed   By: Corrie Mckusick D.O.   On: 06/06/2019 08:52     CBC Recent Labs  Lab 06/06/19 0905 06/07/19 0546  WBC 9.9 6.1  HGB 13.9 12.2  HCT 44.9 39.9  PLT  229 201  MCV 95.3 95.9  MCH 29.5 29.3  MCHC 31.0 30.6  RDW 12.4 12.6  LYMPHSABS 0.9  --   MONOABS 0.3  --   EOSABS 0.0  --   BASOSABS 0.0  --     Chemistries  Recent Labs  Lab 06/06/19 0905 06/07/19 0546  NA 140 142  K 4.7 3.6  CL 97* 106  CO2 31 27  GLUCOSE 288* 144*  BUN 13 10  CREATININE 0.75 0.66  CALCIUM 10.0 8.8*  AST 17  --   ALT 20  --   ALKPHOS 55  --   BILITOT 0.8  --    ------------------------------------------------------------------------------------------------------------------ No results for input(s): CHOL, HDL, LDLCALC, TRIG, CHOLHDL, LDLDIRECT in the last 72  hours.  No results found for: HGBA1C ------------------------------------------------------------------------------------------------------------------ No results for input(s): TSH, T4TOTAL, T3FREE, THYROIDAB in the last 72 hours.  Invalid input(s): FREET3 ------------------------------------------------------------------------------------------------------------------ No results for input(s): VITAMINB12, FOLATE, FERRITIN, TIBC, IRON, RETICCTPCT in the last 72 hours.  Coagulation profile No results for input(s): INR, PROTIME in the last 168 hours.  No results for input(s): DDIMER in the last 72 hours.  Cardiac Enzymes No results for input(s): CKMB, TROPONINI, MYOGLOBIN in the last 168 hours.  Invalid input(s): CK ------------------------------------------------------------------------------------------------------------------ No results found for: BNP   Roxan Hockey M.D on 06/07/2019 at 6:14 PM  Go to [email or web address] - for contact info  [facility] - Office  [phone]

## 2019-06-08 ENCOUNTER — Inpatient Hospital Stay (HOSPITAL_COMMUNITY): Payer: No Typology Code available for payment source

## 2019-06-08 LAB — CBC
HCT: 39.9 % (ref 36.0–46.0)
Hemoglobin: 11.9 g/dL — ABNORMAL LOW (ref 12.0–15.0)
MCH: 29 pg (ref 26.0–34.0)
MCHC: 29.8 g/dL — ABNORMAL LOW (ref 30.0–36.0)
MCV: 97.3 fL (ref 80.0–100.0)
Platelets: 197 10*3/uL (ref 150–400)
RBC: 4.1 MIL/uL (ref 3.87–5.11)
RDW: 12.5 % (ref 11.5–15.5)
WBC: 3.2 10*3/uL — ABNORMAL LOW (ref 4.0–10.5)
nRBC: 0 % (ref 0.0–0.2)

## 2019-06-08 LAB — BASIC METABOLIC PANEL
Anion gap: 11 (ref 5–15)
BUN: 12 mg/dL (ref 8–23)
CO2: 26 mmol/L (ref 22–32)
Calcium: 8.6 mg/dL — ABNORMAL LOW (ref 8.9–10.3)
Chloride: 105 mmol/L (ref 98–111)
Creatinine, Ser: 0.64 mg/dL (ref 0.44–1.00)
GFR calc Af Amer: >60 mL/min (ref >60)
GFR calc non Af Amer: >60 mL/min (ref >60)
Glucose, Bld: 149 mg/dL — ABNORMAL HIGH (ref 70–99)
Potassium: 3.7 mmol/L (ref 3.5–5.1)
Sodium: 142 mmol/L (ref 135–145)

## 2019-06-08 LAB — URINALYSIS, ROUTINE W REFLEX MICROSCOPIC
Bilirubin Urine: NEGATIVE
Glucose, UA: NEGATIVE mg/dL
Hgb urine dipstick: NEGATIVE
Ketones, ur: 80 mg/dL — AB
Leukocytes,Ua: NEGATIVE
Nitrite: NEGATIVE
Protein, ur: NEGATIVE mg/dL
Specific Gravity, Urine: 1.029 (ref 1.005–1.030)
pH: 5 (ref 5.0–8.0)

## 2019-06-08 LAB — GLUCOSE, CAPILLARY
Glucose-Capillary: 146 mg/dL — ABNORMAL HIGH (ref 70–99)
Glucose-Capillary: 122 mg/dL — ABNORMAL HIGH (ref 70–99)
Glucose-Capillary: 158 mg/dL — ABNORMAL HIGH (ref 70–99)
Glucose-Capillary: 142 mg/dL — ABNORMAL HIGH (ref 70–99)

## 2019-06-08 MED ORDER — POTASSIUM CHLORIDE CRYS ER 20 MEQ PO TBCR
40.0000 meq | EXTENDED_RELEASE_TABLET | ORAL | Status: AC
  Administered 2019-06-08 (×2): 40 meq via ORAL
  Filled 2019-06-08 (×2): qty 2

## 2019-06-08 MED ORDER — ACETAMINOPHEN 325 MG PO TABS
650.0000 mg | ORAL_TABLET | Freq: Four times a day (QID) | ORAL | Status: DC | PRN
  Administered 2019-06-12: 02:00:00 650 mg via ORAL
  Filled 2019-06-08 (×2): qty 2

## 2019-06-08 MED ORDER — MILK AND MOLASSES ENEMA
1.0000 | Freq: Once | RECTAL | Status: AC
  Administered 2019-06-08: 240 mL via RECTAL

## 2019-06-08 MED ORDER — POLYETHYLENE GLYCOL 3350 17 G PO PACK
17.0000 g | PACK | Freq: Every day | ORAL | Status: DC
  Administered 2019-06-08 – 2019-06-09 (×2): 17 g via ORAL
  Filled 2019-06-08 (×2): qty 1

## 2019-06-08 MED ORDER — ACETAMINOPHEN 650 MG RE SUPP
650.0000 mg | Freq: Once | RECTAL | Status: AC
  Administered 2019-06-08: 11:00:00 650 mg via RECTAL
  Filled 2019-06-08: qty 1

## 2019-06-08 NOTE — Progress Notes (Signed)
Subjective: Patient still has intermittent crampy abdominal pain.  She did have a bowel movement after Dulcolax suppository yesterday evening.  She denies any nausea or vomiting.  Objective: Vital signs in last 24 hours: Temp:  [97.8 F (36.6 C)-99.8 F (37.7 C)] 98.8 F (37.1 C) (11/08 0737) Pulse Rate:  [75-102] 92 (11/08 0737) Resp:  [15-21] 17 (11/08 0737) BP: (157-164)/(68-90) 159/78 (11/08 0455) SpO2:  [89 %-99 %] 92 % (11/08 0737) Last BM Date: 06/07/19  Intake/Output from previous day: 11/07 0701 - 11/08 0700 In: 3143.3 [P.O.:480; I.V.:2513.3; IV Piggyback:150] Out: -  Intake/Output this shift: No intake/output data recorded.  General appearance: alert, cooperative and no distress GI: Soft with minimal bowel sounds appreciated.  Slightly distended.  No rigidity is noted.  Lab Results:  Recent Labs    06/07/19 0546 06/08/19 0450  WBC 6.1 3.2*  HGB 12.2 11.9*  HCT 39.9 39.9  PLT 201 197   BMET Recent Labs    06/07/19 0546 06/08/19 0450  NA 142 142  K 3.6 3.7  CL 106 105  CO2 27 26  GLUCOSE 144* 149*  BUN 10 12  CREATININE 0.66 0.64  CALCIUM 8.8* 8.6*   PT/INR No results for input(s): LABPROT, INR in the last 72 hours.  Studies/Results: US Abdomen Complete  Result Date: 06/06/2019 CLINICAL DATA:  Right upper quadrant epigastric and left upper quadrant pain. EXAM: ABDOMEN ULTRASOUND COMPLETE COMPARISON:  02/14/2018 FINDINGS: Gallbladder: No gallstones or wall thickening visualized. No sonographic Murphy sign noted by sonographer. Common bile duct: Diameter: 6 mm, unchanged from prior study. Liver: Mildly heterogeneous echotexture with slight increased echogenicity relative to right kidney. Portal vein is patent on color Doppler imaging with normal direction of blood flow towards the liver. IVC: No abnormality visualized. Pancreas: Visualized portion unremarkable. Spleen: Size and appearance within normal limits. Right Kidney: Length: 10.2 cm.  Echogenicity within normal limits. No mass or hydronephrosis visualized. Left Kidney: Length: 11.1 cm. Echogenicity within normal limits. No mass or hydronephrosis visualized. Abdominal aorta: No aneurysm visualized. Other findings: None. IMPRESSION: 1. Hepatic steatosis and mildly coarsened echotexture. Correlate with any clinical or laboratory evidence of liver disease. 2. No acute findings. Electronically Signed   By: Zetta Bills M.D.   On: 06/06/2019 10:11   Ct Abdomen Pelvis W Contrast  Result Date: 06/06/2019 CLINICAL DATA:  Right upper quadrant, epigastric, left upper quadrant and mid to lower abdominal pain since yesterday. Vomiting. History of colon cancer, chemotherapy, hysterectomy, colon and bladder surgery. EXAM: CT ABDOMEN AND PELVIS WITH CONTRAST TECHNIQUE: Multidetector CT imaging of the abdomen and pelvis was performed using the standard protocol following bolus administration of intravenous contrast. CONTRAST:  12mL OMNIPAQUE IOHEXOL 300 MG/ML  SOLN COMPARISON:  02/14/2018 abdominal sonogram. Outside CT abdomen/pelvis from 06/25/2017. FINDINGS: Lower chest: No significant pulmonary nodules or acute consolidative airspace disease. Hepatobiliary: Normal liver size. No liver mass. Normal gallbladder with no radiopaque cholelithiasis. No biliary ductal dilatation. Pancreas: Hypodense 0.6 cm anterior pancreatic head lesion (series 2/image 33), not definitely seen on prior CT. No additional pancreatic lesions. No pancreatic duct dilation. Spleen: Normal size. No mass. Adrenals/Urinary Tract: Normal adrenals. Normal kidneys with no hydronephrosis and no renal mass. Normal bladder. Stomach/Bowel: Small hiatal hernia. Otherwise normal nondistended stomach. There are postsurgical changes from subtotal right hemicolectomy with ileocolic anastomosis in the right abdomen. There are several mildly dilated small bowel loops throughout the mid to distal small bowel measuring up to 3.7 cm diameter. Mild  small bowel wall thickening within a  few small bowel loops in the right lower abdomen. The distal small bowel is collapsed. There is a suggestion of a focal small bowel caliber transition in the right lower quadrant (series 2/image 65), without appreciable obstructing mass or associated hernia. Findings are compatible with mechanical mid to distal small bowel obstruction. No definite pneumatosis. Remnant large bowel is relatively collapsed with no diverticulosis or definite large bowel wall thickening. Vascular/Lymphatic: Atherosclerotic nonaneurysmal abdominal aorta. Patent portal, splenic, hepatic and renal veins. No pathologically enlarged lymph nodes in the abdomen or pelvis. Reproductive: Status post hysterectomy, with no abnormal findings at the vaginal cuff. No adnexal mass. Other: No pneumoperitoneum. Trace free fluid in the pelvis and right pericolic gutter. No focal fluid collection. Musculoskeletal: No aggressive appearing focal osseous lesions. Mild thoracolumbar spondylosis. IMPRESSION: 1. CT findings are compatible with mechanical small-bowel obstruction in the mid to distal small bowel, with suggestion of transition point in the right lower quadrant. Favor adhesions as the cause given postsurgical changes from subtotal right hemicolectomy with ileocolic anastomosis in the right abdomen. 2. Nonspecific mild wall thickening within a few of the mildly dilated small bowel loops in the right abdomen. No definite pneumatosis. No pneumoperitoneum. Trace free fluid in the pelvis and right abdomen. 3. No lymphadenopathy or other findings of metastatic disease in the abdomen or pelvis. 4. Subcentimeter low-attenuation anterior pancreatic head lesion, not definitely seen on 06/25/2017 outside CT abdomen study. No biliary or pancreatic duct dilation. Suggest short-term outpatient follow-up MRI abdomen without and with IV contrast for further characterization. 5. Small hiatal hernia. 6.  Aortic Atherosclerosis  (ICD10-I70.0). Electronically Signed   By: Ilona Sorrel M.D.   On: 06/06/2019 15:45   KUB done today reveals air in the small bowel as well as air and stool in the right colon. Anti-infectives: Anti-infectives (From admission, onward)   None      Assessment/Plan: Impression: Bowel obstruction, partial.  Appears to be more ileus in nature.  No need for surgical intervention. Plan: We will start clear liquid diet.  Patient is already ambulating.  We will also write for MiraLAX and a molasses enema.  LOS: 2 days    Aviva Signs 06/08/2019

## 2019-06-08 NOTE — Progress Notes (Signed)
Patient Demographics:    Tammy Thompson, is a 63 y.o. female, DOB - 07/27/1956, [id number]  Admit date - 06/06/2019   Admitting Physician Truett Mainland, DO  Outpatient Primary MD for the patient is [facility], [facility]  LOS - 2  Chief Complaint  Patient presents with   Abdominal Pain        Subjective:    Tammy Thompson today has No chest pain,  -Had formed BM last evening after Dulcolax suppository -No further emesis, mild nausea -T-max 100.7 denies urinary symptoms or productive cough    Assessment  & Plan :    Principal Problem:   SBO (small bowel obstruction) ([place]) Active Problems:   Type 2 diabetes mellitus without complication (HCC)   Cancer ([place])   Essential hypertension   Small bowel obstruction ([place])  Brief Summary 63 y.o. female with a history of colon cancer, history of colon cancer with subtotal right hemicolectomy and ileocolic anastomosis, bladder surgery, diabetes, hypertension, hypothyroidism admitted with abdominal pain, emesis and bowel obstruction presumed secondary to adhesions   A/p 1)SBO-secondary to presumed adhesive process--surgical consult appreciated -use MiraLAX and a molasses enema. -Give NG tube if emesis -As needed IV Toradol or IV morphine sulfate -As needed Zofran -Per general surgeon okay to give clear liquids  2)DM2-no recent A1c available, - hold Amaryl, hold metformin while patient is n.p.o. Allow some permissive Hyperglycemia rather than risk life-threatening hypoglycemia in a patient with unreliable oral intake. Use Novolog/Humalog Sliding scale insulin with Accu-Cheks/Fingersticks as ordered  3)HTN--  may use IV labetalol when necessary  Every 4 hours for systolic blood pressure over 160 mmhg  4)Fevers-UA does not look suspicious for UTI, no productive cough chest exam unremarkable, blood cultures requested -Hold off on antibiotics, watch  respiratory status closely as patient is at risk for aspiration pneumonia given SBO with emesis PTA  Disposition/Need for in-Hospital Stay- patient unable to be discharged at this time due to SBO--- requiring IV fluids until reliable oral intake resumes  Code Status : Full  Family Communication:   NA (patient is alert, awake and coherent)  Disposition Plan  : TBD  Consults  :  Gen surg  DVT Prophylaxis  :  - Heparin - SCDs   Lab Results  Component Value Date   PLT 197 06/08/2019    Inpatient Medications  Scheduled Meds:  aspirin EC  81 mg Oral Daily   Chlorhexidine Gluconate Cloth  6 each Topical Daily   gabapentin  400 mg Oral QHS   heparin injection (subcutaneous)  5,000 Units Subcutaneous Q8H   insulin aspart  0-5 Units Subcutaneous QHS   insulin aspart  0-9 Units Subcutaneous TID WC   levothyroxine  25 mcg Oral Q0600   polyethylene glycol  17 g Oral Daily   Continuous Infusions:  sodium chloride 100 mL/hr at 06/08/19 1456   famotidine (PEPCID) IV 20 mg (06/08/19 0823)   PRN Meds:.acetaminophen, ketorolac, labetalol, morphine injection, ondansetron **OR** ondansetron (ZOFRAN) IV    Anti-infectives (From admission, onward)   None        Objective:   Vitals:   06/08/19 0600 06/08/19 0737 06/08/19 0933 06/08/19 1429  BP:   (!) 162/92 (!) 165/92  Pulse: 93 92 92 95  Resp: 19  17 18 20   Temp:  98.8 F (37.1 C) (!) 100.7 F (38.2 C) 99.3 F (37.4 C)  TempSrc:  Oral Oral Oral  SpO2: 97% 92% 93% 93%  Weight:      Height:        Wt Readings from Last 3 Encounters:  06/07/19 73.5 kg  10/01/18 71.7 kg  09/27/18 71.7 kg     Intake/Output Summary (Last 24 hours) at 06/08/2019 1718 Last data filed at 06/08/2019 1500 Gross per 24 hour  Intake 2545.8 ml  Output --  Net 2545.8 ml     Physical Exam  Gen:- Awake Alert,  In no apparent distress  HEENT:- [place].AT, No sclera icterus Neck-Supple Neck,No JVD,.  Lungs-  CTAB , fair symmetrical air  movement CV- S1, S2 normal, regular  Abd-diminished bowel sounds,  scar from prior laparotomy noted, abdomen is less tender, less distended Extremity/Skin:- No  edema, pedal pulses present  Psych-affect is appropriate, oriented x3 Neuro-no new focal deficits, no tremors   Data Review:   Micro Results Recent Results (from the past 240 hour(s))  SARS CORONAVIRUS 2 (TAT 6-24 HRS) Nasopharyngeal Nasopharyngeal Swab     Status: None   Collection Time: 06/06/19  9:17 PM   Specimen: Nasopharyngeal Swab  Result Value Ref Range Status   SARS Coronavirus 2 NEGATIVE NEGATIVE Final    Comment: (NOTE) SARS-CoV-2 target nucleic acids are NOT DETECTED. The SARS-CoV-2 RNA is generally detectable in upper and lower respiratory specimens during the acute phase of infection. Negative results do not preclude SARS-CoV-2 infection, do not rule out co-infections with other pathogens, and should not be used as the sole basis for treatment or other patient management decisions. Negative results must be combined with clinical observations, patient history, and epidemiological information. The expected result is Negative. Fact Sheet for Patients: SugarRoll.be Fact Sheet for Healthcare Providers: https://www.woods-mathews.com/ This test is not yet approved or cleared by the [place] FDA and  has been authorized for detection and/or diagnosis of SARS-CoV-2 by FDA under an Emergency Use Authorization (EUA). This EUA will remain  in effect (meaning this test can be used) for the duration of the COVID-19 declaration under Section 56 4(b)(1) of the Act, 21 U.S.C. section 360bbb-3(b)(1), unless the authorization is terminated or revoked sooner. Performed at [facility], [place] [address] [place], [place] [address]   MRSA PCR Screening     Status: None   Collection Time: 06/07/19  7:54 AM   Specimen: Nasal Mucosa; Nasopharyngeal  Result Value Ref Range  Status   MRSA by PCR NEGATIVE NEGATIVE Final    Comment:        The GeneXpert MRSA Assay (FDA approved for NASAL specimens only), is one component of a comprehensive MRSA colonization surveillance program. It is not intended to diagnose MRSA infection nor to guide or monitor treatment for MRSA infections. Performed at [facility], [address] [place], [place] [address]   Culture, blood (Routine X 2) w Reflex to ID Panel     Status: None (Preliminary result)   Collection Time: 06/08/19 11:56 AM   Specimen: Right Antecubital; Blood  Result Value Ref Range Status   Specimen Description RIGHT ANTECUBITAL  Final   Special Requests   Final    BOTTLES DRAWN AEROBIC AND ANAEROBIC Blood Culture adequate volume Performed at [facility], [address] [place], [place] [address]    Culture PENDING  Incomplete   Report Status PENDING  Incomplete  Culture, blood (Routine X 2) w  Reflex to ID Panel     Status: None (Preliminary result)   Collection Time: 06/08/19 11:59 AM   Specimen: BLOOD LEFT ARM  Result Value Ref Range Status   Specimen Description BLOOD LEFT ARM  Final   Special Requests   Final    BOTTLES DRAWN AEROBIC AND ANAEROBIC Blood Culture adequate volume Performed at [facility], [address], [place], [place] [address]    Culture PENDING  Incomplete   Report Status PENDING  Incomplete    Radiology Reports US Abdomen Complete  Result Date: 06/06/2019 CLINICAL DATA:  Right upper quadrant epigastric and left upper quadrant pain. EXAM: ABDOMEN ULTRASOUND COMPLETE COMPARISON:  02/14/2018 FINDINGS: Gallbladder: No gallstones or wall thickening visualized. No sonographic Murphy sign noted by sonographer. Common bile duct: Diameter: 6 mm, unchanged from prior study. Liver: Mildly heterogeneous echotexture with slight increased echogenicity relative to right kidney. Portal vein is patent on color Doppler imaging with normal direction of blood flow towards the liver. IVC: No  abnormality visualized. Pancreas: Visualized portion unremarkable. Spleen: Size and appearance within normal limits. Right Kidney: Length: 10.2 cm. Echogenicity within normal limits. No mass or hydronephrosis visualized. Left Kidney: Length: 11.1 cm. Echogenicity within normal limits. No mass or hydronephrosis visualized. Abdominal aorta: No aneurysm visualized. Other findings: None. IMPRESSION: 1. Hepatic steatosis and mildly coarsened echotexture. Correlate with any clinical or laboratory evidence of liver disease. 2. No acute findings. Electronically Signed   By: Zetta Bills M.D.   On: 06/06/2019 10:11   Ct Abdomen Pelvis W Contrast  Result Date: 06/06/2019 CLINICAL DATA:  Right upper quadrant, epigastric, left upper quadrant and mid to lower abdominal pain since yesterday. Vomiting. History of colon cancer, chemotherapy, hysterectomy, colon and bladder surgery. EXAM: CT ABDOMEN AND PELVIS WITH CONTRAST TECHNIQUE: Multidetector CT imaging of the abdomen and pelvis was performed using the standard protocol following bolus administration of intravenous contrast. CONTRAST:  19mL OMNIPAQUE IOHEXOL 300 MG/ML  SOLN COMPARISON:  02/14/2018 abdominal sonogram. Outside CT abdomen/pelvis from 06/25/2017. FINDINGS: Lower chest: No significant pulmonary nodules or acute consolidative airspace disease. Hepatobiliary: Normal liver size. No liver mass. Normal gallbladder with no radiopaque cholelithiasis. No biliary ductal dilatation. Pancreas: Hypodense 0.6 cm anterior pancreatic head lesion (series 2/image 33), not definitely seen on prior CT. No additional pancreatic lesions. No pancreatic duct dilation. Spleen: Normal size. No mass. Adrenals/Urinary Tract: Normal adrenals. Normal kidneys with no hydronephrosis and no renal mass. Normal bladder. Stomach/Bowel: Small hiatal hernia. Otherwise normal nondistended stomach. There are postsurgical changes from subtotal right hemicolectomy with ileocolic anastomosis in the  right abdomen. There are several mildly dilated small bowel loops throughout the mid to distal small bowel measuring up to 3.7 cm diameter. Mild small bowel wall thickening within a few small bowel loops in the right lower abdomen. The distal small bowel is collapsed. There is a suggestion of a focal small bowel caliber transition in the right lower quadrant (series 2/image 65), without appreciable obstructing mass or associated hernia. Findings are compatible with mechanical mid to distal small bowel obstruction. No definite pneumatosis. Remnant large bowel is relatively collapsed with no diverticulosis or definite large bowel wall thickening. Vascular/Lymphatic: Atherosclerotic nonaneurysmal abdominal aorta. Patent portal, splenic, hepatic and renal veins. No pathologically enlarged lymph nodes in the abdomen or pelvis. Reproductive: Status post hysterectomy, with no abnormal findings at the vaginal cuff. No adnexal mass. Other: No pneumoperitoneum. Trace free fluid in the pelvis and right pericolic gutter. No focal fluid collection. Musculoskeletal: No aggressive appearing focal osseous  lesions. Mild thoracolumbar spondylosis. IMPRESSION: 1. CT findings are compatible with mechanical small-bowel obstruction in the mid to distal small bowel, with suggestion of transition point in the right lower quadrant. Favor adhesions as the cause given postsurgical changes from subtotal right hemicolectomy with ileocolic anastomosis in the right abdomen. 2. Nonspecific mild wall thickening within a few of the mildly dilated small bowel loops in the right abdomen. No definite pneumatosis. No pneumoperitoneum. Trace free fluid in the pelvis and right abdomen. 3. No lymphadenopathy or other findings of metastatic disease in the abdomen or pelvis. 4. Subcentimeter low-attenuation anterior pancreatic head lesion, not definitely seen on 06/25/2017 outside CT abdomen study. No biliary or pancreatic duct dilation. Suggest short-term  outpatient follow-up MRI abdomen without and with IV contrast for further characterization. 5. Small hiatal hernia. 6.  Aortic Atherosclerosis (ICD10-I70.0). Electronically Signed   By: Ilona Sorrel M.D.   On: 06/06/2019 15:45   Dg Chest Portable 1 View  Result Date: 06/06/2019 CLINICAL DATA:  63 year old female with right upper quadrant and epigastric pain EXAM: PORTABLE CHEST 1 VIEW COMPARISON:  05/21/2018 FINDINGS: Cardiomediastinal silhouette unchanged in size and contour. No evidence of central vascular congestion. No pneumothorax or pleural effusion. Similar appearance of coarsened interstitial markings. IMPRESSION: Negative for acute cardiopulmonary disease Electronically Signed   By: Corrie Mckusick D.O.   On: 06/06/2019 08:52   Dg Abd 2 Views  Result Date: 06/08/2019 CLINICAL DATA:  Follow-up CT findings of small bowel obstruction. EXAM: ABDOMEN - 2 VIEW COMPARISON:  CT 06/06/2019 FINDINGS: Examination demonstrates persistent air-filled mildly dilated small bowel loops measuring up to 3.8 cm in diameter. No significant air-fluid levels. No evidence of free peritoneal air. Air is present throughout the colon. Mild degenerate change of the spine and hips. Multiple pelvic phleboliths. IMPRESSION: Persistent air-filled dilated small bowel loops compatible with known small bowel obstruction. Electronically Signed   By: Marin Olp M.D.   On: 06/08/2019 09:24     CBC Recent Labs  Lab 06/06/19 0905 06/07/19 0546 06/08/19 0450  WBC 9.9 6.1 3.2*  HGB 13.9 12.2 11.9*  HCT 44.9 39.9 39.9  PLT 229 201 197  MCV 95.3 95.9 97.3  MCH 29.5 29.3 29.0  MCHC 31.0 30.6 29.8*  RDW 12.4 12.6 12.5  LYMPHSABS 0.9  --   --   MONOABS 0.3  --   --   EOSABS 0.0  --   --   BASOSABS 0.0  --   --     Chemistries  Recent Labs  Lab 06/06/19 0905 06/07/19 0546 06/08/19 0450  NA 140 142 142  K 4.7 3.6 3.7  CL 97* 106 105  CO2 31 27 26   GLUCOSE 288* 144* 149*  BUN 13 10 12   CREATININE 0.75 0.66 0.64   CALCIUM 10.0 8.8* 8.6*  AST 17  --   --   ALT 20  --   --   ALKPHOS 55  --   --   BILITOT 0.8  --   --    ------------------------------------------------------------------------------------------------------------------ No results for input(s): CHOL, HDL, LDLCALC, TRIG, CHOLHDL, LDLDIRECT in the last 72 hours.  No results found for: HGBA1C ------------------------------------------------------------------------------------------------------------------ No results for input(s): TSH, T4TOTAL, T3FREE, THYROIDAB in the last 72 hours.  Invalid input(s): FREET3 ------------------------------------------------------------------------------------------------------------------ No results for input(s): VITAMINB12, FOLATE, FERRITIN, TIBC, IRON, RETICCTPCT in the last 72 hours.  Coagulation profile No results for input(s): INR, PROTIME in the last 168 hours.  No results for input(s): DDIMER in the last 72 hours.  Cardiac Enzymes No results for input(s): CKMB, TROPONINI, MYOGLOBIN in the last 168 hours.  Invalid input(s): CK ------------------------------------------------------------------------------------------------------------------ No results found for: BNP   Roxan Hockey M.D on 06/08/2019 at 5:18 PM  Go to [email or web address] - for contact info  [facility] - Office  [phone]

## 2019-06-08 NOTE — Progress Notes (Signed)
Report given to Evalyn Casco, RN

## 2019-06-09 ENCOUNTER — Inpatient Hospital Stay (HOSPITAL_COMMUNITY): Payer: No Typology Code available for payment source

## 2019-06-09 LAB — GLUCOSE, CAPILLARY
Glucose-Capillary: 172 mg/dL — ABNORMAL HIGH (ref 70–99)
Glucose-Capillary: 181 mg/dL — ABNORMAL HIGH (ref 70–99)
Glucose-Capillary: 132 mg/dL — ABNORMAL HIGH (ref 70–99)
Glucose-Capillary: 116 mg/dL — ABNORMAL HIGH (ref 70–99)

## 2019-06-09 LAB — HEMOGLOBIN A1C
Hgb A1c MFr Bld: 8.3 % — ABNORMAL HIGH (ref 4.8–5.6)
Mean Plasma Glucose: 192 mg/dL

## 2019-06-09 MED ORDER — LORAZEPAM 2 MG/ML IJ SOLN
0.5000 mg | Freq: Four times a day (QID) | INTRAMUSCULAR | Status: DC | PRN
  Administered 2019-06-10: 01:00:00 1 mg via INTRAVENOUS
  Filled 2019-06-09: qty 1

## 2019-06-09 MED ORDER — LORAZEPAM 2 MG/ML IJ SOLN
INTRAMUSCULAR | Status: AC
  Filled 2019-06-09: qty 1

## 2019-06-09 MED ORDER — PHENOL 1.4 % MT LIQD
1.0000 | OROMUCOSAL | Status: DC | PRN
  Filled 2019-06-09: qty 177

## 2019-06-09 MED ORDER — AMLODIPINE BESYLATE 5 MG PO TABS
10.0000 mg | ORAL_TABLET | Freq: Every day | ORAL | Status: DC
  Administered 2019-06-09: 10 mg via ORAL
  Filled 2019-06-09: qty 2

## 2019-06-09 MED ORDER — LORAZEPAM 2 MG/ML IJ SOLN
0.5000 mg | Freq: Once | INTRAMUSCULAR | Status: AC
  Administered 2019-06-09: 0.5 mg via INTRAVENOUS

## 2019-06-09 MED ORDER — NITROGLYCERIN 2 % TD OINT
1.0000 [in_us] | TOPICAL_OINTMENT | Freq: Three times a day (TID) | TRANSDERMAL | Status: AC
  Administered 2019-06-09 – 2019-06-12 (×9): 1 [in_us] via TOPICAL
  Filled 2019-06-09 (×9): qty 1

## 2019-06-09 NOTE — Progress Notes (Signed)
  Subjective: Patient feels like she is refluxing acid.  She did have small results with the enema, but still feels distended.  She states her abdominal pain is less.  Objective: Vital signs in last 24 hours: Temp:  [98.6 F (37 C)-99.9 F (37.7 C)] 98.6 F (37 C) (11/09 0551) Pulse Rate:  [86-95] 86 (11/09 0811) Resp:  [18-20] 18 (11/09 0551) BP: (161-182)/(88-95) 161/95 (11/09 0811) SpO2:  [93 %-95 %] 94 % (11/09 0551) Last BM Date: 06/09/19  Intake/Output from previous day: 11/08 0701 - 11/09 0700 In: 480 [P.O.:480] Out: -  Intake/Output this shift: Total I/O In: -  Out: 1000 [Emesis/NG output:1000]  General appearance: alert, cooperative and no distress GI: Soft but distended.  Minimal bowel sounds appreciated.  No rigidity is noted.  Lab Results:  Recent Labs    06/07/19 0546 06/08/19 0450  WBC 6.1 3.2*  HGB 12.2 11.9*  HCT 39.9 39.9  PLT 201 197   BMET Recent Labs    06/07/19 0546 06/08/19 0450  NA 142 142  K 3.6 3.7  CL 106 105  CO2 27 26  GLUCOSE 144* 149*  BUN 10 12  CREATININE 0.66 0.64  CALCIUM 8.8* 8.6*   PT/INR No results for input(s): LABPROT, INR in the last 72 hours.  Studies/Results: Dg Abd 2 Views  Result Date: 06/08/2019 CLINICAL DATA:  Follow-up CT findings of small bowel obstruction. EXAM: ABDOMEN - 2 VIEW COMPARISON:  CT 06/06/2019 FINDINGS: Examination demonstrates persistent air-filled mildly dilated small bowel loops measuring up to 3.8 cm in diameter. No significant air-fluid levels. No evidence of free peritoneal air. Air is present throughout the colon. Mild degenerate change of the spine and hips. Multiple pelvic phleboliths. IMPRESSION: Persistent air-filled dilated small bowel loops compatible with known small bowel obstruction. Electronically Signed   By: Marin Olp M.D.   On: 06/08/2019 09:24   Dg Abd Acute 2+v W 1v Chest  Result Date: 06/09/2019 CLINICAL DATA:  Abdominal pain. Nasogastric tube placement. EXAM: DG  ABDOMEN ACUTE W/ 1V CHEST COMPARISON:  Chest radiograph dated 06/06/2019, abdomen radiograph obtained yesterday and abdomen pelvis CT obtained yesterday. FINDINGS: Very poor inspiration with interval mild bibasilar atelectasis. Otherwise, clear lungs. Normal sized heart. Little change in multiple mildly dilated loops of small bowel in the left abdomen and stool and gas in normal caliber colon. Interval nasogastric tube with its side hole in the mid stomach and tip in the distal stomach. No free peritoneal air. Mild bilateral hip degenerative changes. Thoracic and lumbar spine degenerative changes and mild scoliosis. IMPRESSION: 1. Very poor inspiration with interval mild bibasilar atelectasis. 2. Stable mild partial small bowel obstruction. 3. Nasogastric tube tip in the distal stomach. Electronically Signed   By: Claudie Revering M.D.   On: 06/09/2019 12:20    Anti-infectives: Anti-infectives (From admission, onward)   None      Assessment/Plan: Impression: Small bowel obstruction versus ileus. Plan: Patient is agreeable to have an NG tube placed.  Further management is pending those results.  NG tube to low intermittent suction.  LOS: 3 days    Aviva Signs 06/09/2019

## 2019-06-09 NOTE — Progress Notes (Signed)
Patient Demographics:    Tammy Thompson, is a 62 y.o. female, DOB - [birth year], [id number]  Admit date - 06/06/2019   Admitting Physician Vertie Dibbern Denton Brick, MD  Outpatient Primary MD for the patient is [facility]  LOS - 3  Chief Complaint  Patient presents with   Abdominal Pain        Subjective:    Tammy Thompson today has No chest pain,   -No further fevers,  Belching a lot Nausea  -Tolerated NG tube placement okay--- more than 2 L of gastric contents    Assessment  & Plan :    Principal Problem:   SBO (small bowel obstruction) ([place]) Active Problems:   Type 2 diabetes mellitus without complication ([place])   Cancer ([place])   Essential hypertension   Small bowel obstruction ([place])  Brief Summary 63 y.o. female with a history of colon cancer, history of colon cancer with subtotal right hemicolectomy and ileocolic anastomosis, bladder surgery, diabetes, hypertension, hypothyroidism admitted with abdominal pain, emesis and bowel obstruction presumed secondary to adhesions   A/p 1)SBO-secondary to presumed adhesive process--surgical consult appreciated -Poor results after  MiraLAX and a molasses enema. -NG tube placed 06/09/2019 -As needed iV morphine sulfate -As needed Zofran -Per general surgeon -N.p.o.  2)DM2-no recent A1c available, - hold Amaryl, hold metformin while patient is n.p.o. Allow some permissive Hyperglycemia rather than risk life-threatening hypoglycemia in a patient with unreliable oral intake. Use Novolog/Humalog Sliding scale insulin with Accu-Cheks/Fingersticks as ordered  3)HTN--  may use IV labetalol when necessary  Every 4 hours for systolic blood pressure over 160 mmhg  4)Fevers-UA does not look suspicious for UTI, no productive cough chest exam unremarkable, blood cultures requested -Hold off on antibiotics, watch respiratory status closely as patient  is at risk for aspiration pneumonia given episodes of emesis in the setting of SBO   Disposition/Need for in-Hospital Stay- patient unable to be discharged at this time due to SBO--- requiring IV fluids while n.p.o. with NG tube in situ  Code Status : Full  Family Communication:   NA (patient is alert, awake and coherent)  Disposition Plan  : TBD  Consults  :  Gen surg  DVT Prophylaxis  :  - Heparin - SCDs   Lab Results  Component Value Date   PLT 197 06/08/2019    Inpatient Medications  Scheduled Meds:  aspirin EC  81 mg Oral Daily   Chlorhexidine Gluconate Cloth  6 each Topical Daily   gabapentin  400 mg Oral QHS   heparin injection (subcutaneous)  5,000 Units Subcutaneous Q8H   insulin aspart  0-5 Units Subcutaneous QHS   insulin aspart  0-9 Units Subcutaneous TID WC   levothyroxine  25 mcg Oral Q0600   nitroGLYCERIN  1 inch Topical Q8H   polyethylene glycol  17 g Oral Daily   Continuous Infusions:  sodium chloride 100 mL/hr at 06/09/19 0657   famotidine (PEPCID) IV 20 mg (06/09/19 0815)   PRN Meds:.acetaminophen, labetalol, morphine injection, ondansetron **OR** ondansetron (ZOFRAN) IV    Anti-infectives (From admission, onward)   None        Objective:   Vitals:   06/08/19 2145 06/08/19 2200 06/09/19 0551 06/09/19 0811  BP: (!) 166/88  (!) 182/94 Marland Kitchen)  161/95  Pulse: 94  88 86  Resp: 18  18   Temp: 99.9 F (37.7 C) 99 F (37.2 C) 98.6 F (37 C)   TempSrc: Oral Oral Oral   SpO2: 95%  94%   Weight:      Height:        Wt Readings from Last 3 Encounters:  06/07/19 73.5 kg  10/01/18 71.7 kg  09/27/18 71.7 kg     Intake/Output Summary (Last 24 hours) at 06/09/2019 1151 Last data filed at 06/08/2019 1700 Gross per 24 hour  Intake 480 ml  Output --  Net 480 ml   Physical Exam  Gen:- Awake Alert,  In no apparent distress  HEENT:- [place].AT, No sclera icterus Neck-Supple Neck,No JVD,.  Nose- NG tube with gastric contents Lungs-  CTAB ,  fair symmetrical air movement CV- S1, S2 normal, regular  Abd-diminished bowel sounds,  scar from prior laparotomy noted, abdomen is less tender, less distended Extremity/Skin:- No  edema, pedal pulses present  Psych-affect is appropriate, oriented x3 Neuro-no new focal deficits, no tremors   Data Review:   Micro Results Recent Results (from the past 240 hour(s))  SARS CORONAVIRUS 2 (TAT 6-24 HRS) Nasopharyngeal Nasopharyngeal Swab     Status: None   Collection Time: 06/06/19  9:17 PM   Specimen: Nasopharyngeal Swab  Result Value Ref Range Status   SARS Coronavirus 2 NEGATIVE NEGATIVE Final    Comment: (NOTE) SARS-CoV-2 target nucleic acids are NOT DETECTED. The SARS-CoV-2 RNA is generally detectable in upper and lower respiratory specimens during the acute phase of infection. Negative results do not preclude SARS-CoV-2 infection, do not rule out co-infections with other pathogens, and should not be used as the sole basis for treatment or other patient management decisions. Negative results must be combined with clinical observations, patient history, and epidemiological information. The expected result is Negative. Fact Sheet for Patients: SugarRoll.be Fact Sheet for Healthcare Providers: https://www.woods-mathews.com/ This test is not yet approved or cleared by the [place] FDA and  has been authorized for detection and/or diagnosis of SARS-CoV-2 by FDA under an Emergency Use Authorization (EUA). This EUA will remain  in effect (meaning this test can be used) for the duration of the COVID-19 declaration under Section 56 4(b)(1) of the Act, 21 U.S.C. section 360bbb-3(b)(1), unless the authorization is terminated or revoked sooner. Performed at [facility], [place] [address] [place], [place] [address]   MRSA PCR Screening     Status: None   Collection Time: 06/07/19  7:54 AM   Specimen: Nasal Mucosa; Nasopharyngeal  Result  Value Ref Range Status   MRSA by PCR NEGATIVE NEGATIVE Final    Comment:        The GeneXpert MRSA Assay (FDA approved for NASAL specimens only), is one component of a comprehensive MRSA colonization surveillance program. It is not intended to diagnose MRSA infection nor to guide or monitor treatment for MRSA infections. Performed at [facility], [address], [place], [place] [address]   Culture, blood (Routine X 2) w Reflex to ID Panel     Status: None (Preliminary result)   Collection Time: 06/08/19 11:56 AM   Specimen: Right Antecubital; Blood  Result Value Ref Range Status   Specimen Description RIGHT ANTECUBITAL  Final   Special Requests   Final    BOTTLES DRAWN AEROBIC AND ANAEROBIC Blood Culture adequate volume   Culture   Final    NO GROWTH < 24 HOURS Performed at [facility],  [address] [place], [place] [address]    Report Status PENDING  Incomplete  Culture, blood (Routine X 2) w Reflex to ID Panel     Status: None (Preliminary result)   Collection Time: 06/08/19 11:59 AM   Specimen: BLOOD LEFT ARM  Result Value Ref Range Status   Specimen Description BLOOD LEFT ARM  Final   Special Requests   Final    BOTTLES DRAWN AEROBIC AND ANAEROBIC Blood Culture adequate volume   Culture   Final    NO GROWTH < 24 HOURS Performed at [facility], [address] [place], [place] [address]    Report Status PENDING  Incomplete    Radiology Reports US Abdomen Complete  Result Date: 06/06/2019 CLINICAL DATA:  Right upper quadrant epigastric and left upper quadrant pain. EXAM: ABDOMEN ULTRASOUND COMPLETE COMPARISON:  02/14/2018 FINDINGS: Gallbladder: No gallstones or wall thickening visualized. No sonographic Murphy sign noted by sonographer. Common bile duct: Diameter: 6 mm, unchanged from prior study. Liver: Mildly heterogeneous echotexture with slight increased echogenicity relative to right kidney. Portal vein is patent on color Doppler imaging with normal direction of  blood flow towards the liver. IVC: No abnormality visualized. Pancreas: Visualized portion unremarkable. Spleen: Size and appearance within normal limits. Right Kidney: Length: 10.2 cm. Echogenicity within normal limits. No mass or hydronephrosis visualized. Left Kidney: Length: 11.1 cm. Echogenicity within normal limits. No mass or hydronephrosis visualized. Abdominal aorta: No aneurysm visualized. Other findings: None. IMPRESSION: 1. Hepatic steatosis and mildly coarsened echotexture. Correlate with any clinical or laboratory evidence of liver disease. 2. No acute findings. Electronically Signed   By: Zetta Bills M.D.   On: 06/06/2019 10:11   Ct Abdomen Pelvis W Contrast  Result Date: 06/06/2019 CLINICAL DATA:  Right upper quadrant, epigastric, left upper quadrant and mid to lower abdominal pain since yesterday. Vomiting. History of colon cancer, chemotherapy, hysterectomy, colon and bladder surgery. EXAM: CT ABDOMEN AND PELVIS WITH CONTRAST TECHNIQUE: Multidetector CT imaging of the abdomen and pelvis was performed using the standard protocol following bolus administration of intravenous contrast. CONTRAST:  131mL OMNIPAQUE IOHEXOL 300 MG/ML  SOLN COMPARISON:  02/14/2018 abdominal sonogram. Outside CT abdomen/pelvis from 06/25/2017. FINDINGS: Lower chest: No significant pulmonary nodules or acute consolidative airspace disease. Hepatobiliary: Normal liver size. No liver mass. Normal gallbladder with no radiopaque cholelithiasis. No biliary ductal dilatation. Pancreas: Hypodense 0.6 cm anterior pancreatic head lesion (series 2/image 33), not definitely seen on prior CT. No additional pancreatic lesions. No pancreatic duct dilation. Spleen: Normal size. No mass. Adrenals/Urinary Tract: Normal adrenals. Normal kidneys with no hydronephrosis and no renal mass. Normal bladder. Stomach/Bowel: Small hiatal hernia. Otherwise normal nondistended stomach. There are postsurgical changes from subtotal right  hemicolectomy with ileocolic anastomosis in the right abdomen. There are several mildly dilated small bowel loops throughout the mid to distal small bowel measuring up to 3.7 cm diameter. Mild small bowel wall thickening within a few small bowel loops in the right lower abdomen. The distal small bowel is collapsed. There is a suggestion of a focal small bowel caliber transition in the right lower quadrant (series 2/image 65), without appreciable obstructing mass or associated hernia. Findings are compatible with mechanical mid to distal small bowel obstruction. No definite pneumatosis. Remnant large bowel is relatively collapsed with no diverticulosis or definite large bowel wall thickening. Vascular/Lymphatic: Atherosclerotic nonaneurysmal abdominal aorta. Patent portal, splenic, hepatic and renal veins. No pathologically enlarged lymph nodes in the abdomen or pelvis. Reproductive: Status post hysterectomy, with no abnormal findings at  the vaginal cuff. No adnexal mass. Other: No pneumoperitoneum. Trace free fluid in the pelvis and right pericolic gutter. No focal fluid collection. Musculoskeletal: No aggressive appearing focal osseous lesions. Mild thoracolumbar spondylosis. IMPRESSION: 1. CT findings are compatible with mechanical small-bowel obstruction in the mid to distal small bowel, with suggestion of transition point in the right lower quadrant. Favor adhesions as the cause given postsurgical changes from subtotal right hemicolectomy with ileocolic anastomosis in the right abdomen. 2. Nonspecific mild wall thickening within a few of the mildly dilated small bowel loops in the right abdomen. No definite pneumatosis. No pneumoperitoneum. Trace free fluid in the pelvis and right abdomen. 3. No lymphadenopathy or other findings of metastatic disease in the abdomen or pelvis. 4. Subcentimeter low-attenuation anterior pancreatic head lesion, not definitely seen on 06/25/2017 outside CT abdomen study. No biliary  or pancreatic duct dilation. Suggest short-term outpatient follow-up MRI abdomen without and with IV contrast for further characterization. 5. Small hiatal hernia. 6.  Aortic Atherosclerosis (ICD10-I70.0). Electronically Signed   By: Ilona Sorrel M.D.   On: 06/06/2019 15:45   Dg Chest Portable 1 View  Result Date: 06/06/2019 CLINICAL DATA:  63 year old female with right upper quadrant and epigastric pain EXAM: PORTABLE CHEST 1 VIEW COMPARISON:  05/21/2018 FINDINGS: Cardiomediastinal silhouette unchanged in size and contour. No evidence of central vascular congestion. No pneumothorax or pleural effusion. Similar appearance of coarsened interstitial markings. IMPRESSION: Negative for acute cardiopulmonary disease Electronically Signed   By: Corrie Mckusick D.O.   On: 06/06/2019 08:52   Dg Abd 2 Views  Result Date: 06/08/2019 CLINICAL DATA:  Follow-up CT findings of small bowel obstruction. EXAM: ABDOMEN - 2 VIEW COMPARISON:  CT 06/06/2019 FINDINGS: Examination demonstrates persistent air-filled mildly dilated small bowel loops measuring up to 3.8 cm in diameter. No significant air-fluid levels. No evidence of free peritoneal air. Air is present throughout the colon. Mild degenerate change of the spine and hips. Multiple pelvic phleboliths. IMPRESSION: Persistent air-filled dilated small bowel loops compatible with known small bowel obstruction. Electronically Signed   By: Marin Olp M.D.   On: 06/08/2019 09:24     CBC Recent Labs  Lab 06/06/19 0905 06/07/19 0546 06/08/19 0450  WBC 9.9 6.1 3.2*  HGB 13.9 12.2 11.9*  HCT 44.9 39.9 39.9  PLT 229 201 197  MCV 95.3 95.9 97.3  MCH 29.5 29.3 29.0  MCHC 31.0 30.6 29.8*  RDW 12.4 12.6 12.5  LYMPHSABS 0.9  --   --   MONOABS 0.3  --   --   EOSABS 0.0  --   --   BASOSABS 0.0  --   --     Chemistries  Recent Labs  Lab 06/06/19 0905 06/07/19 0546 06/08/19 0450  NA 140 142 142  K 4.7 3.6 3.7  CL 97* 106 105  CO2 31 27 26   GLUCOSE 288* 144*  149*  BUN 13 10 12   CREATININE 0.75 0.66 0.64  CALCIUM 10.0 8.8* 8.6*  AST 17  --   --   ALT 20  --   --   ALKPHOS 55  --   --   BILITOT 0.8  --   --    ------------------------------------------------------------------------------------------------------------------ No results for input(s): CHOL, HDL, LDLCALC, TRIG, CHOLHDL, LDLDIRECT in the last 72 hours.  Lab Results  Component Value Date   HGBA1C 8.3 (H) 06/07/2019   ------------------------------------------------------------------------------------------------------------------ No results for input(s): TSH, T4TOTAL, T3FREE, THYROIDAB in the last 72 hours.  Invalid input(s): FREET3 ------------------------------------------------------------------------------------------------------------------ No results for  input(s): VITAMINB12, FOLATE, FERRITIN, TIBC, IRON, RETICCTPCT in the last 72 hours.  Coagulation profile No results for input(s): INR, PROTIME in the last 168 hours.  No results for input(s): DDIMER in the last 72 hours.  Cardiac Enzymes No results for input(s): CKMB, TROPONINI, MYOGLOBIN in the last 168 hours.  Invalid input(s): CK ------------------------------------------------------------------------------------------------------------------ No results found for: BNP   Roxan Hockey M.D on 06/09/2019 at 11:51 AM  Go to [email or web address] - for contact info  [facility] - Office  [phone]

## 2019-06-09 NOTE — Progress Notes (Signed)
Order for NG tube to be placed. Pt vomited before putting it in and while staff was inserting the tube. The emesis looked yellow and brown in color. Once advanced, the tube was already getting fluid in it. Currently awaiting x-ray placement before turning on low- intermittent suction. Will continue to monitor.

## 2019-06-10 LAB — CBC
HCT: 36.9 % (ref 36.0–46.0)
Hemoglobin: 11.3 g/dL — ABNORMAL LOW (ref 12.0–15.0)
MCH: 29.6 pg (ref 26.0–34.0)
MCHC: 30.6 g/dL (ref 30.0–36.0)
MCV: 96.6 fL (ref 80.0–100.0)
Platelets: 212 10*3/uL (ref 150–400)
RBC: 3.82 MIL/uL — ABNORMAL LOW (ref 3.87–5.11)
RDW: 12.3 % (ref 11.5–15.5)
WBC: 4.8 10*3/uL (ref 4.0–10.5)
nRBC: 0 % (ref 0.0–0.2)

## 2019-06-10 LAB — BASIC METABOLIC PANEL
Anion gap: 7 (ref 5–15)
BUN: 11 mg/dL (ref 8–23)
CO2: 27 mmol/L (ref 22–32)
Calcium: 8.3 mg/dL — ABNORMAL LOW (ref 8.9–10.3)
Chloride: 108 mmol/L (ref 98–111)
Creatinine, Ser: 0.59 mg/dL (ref 0.44–1.00)
GFR calc Af Amer: >60 mL/min (ref >60)
GFR calc non Af Amer: >60 mL/min (ref >60)
Glucose, Bld: 121 mg/dL — ABNORMAL HIGH (ref 70–99)
Potassium: 3.7 mmol/L (ref 3.5–5.1)
Sodium: 142 mmol/L (ref 135–145)

## 2019-06-10 LAB — GLUCOSE, CAPILLARY
Glucose-Capillary: 114 mg/dL — ABNORMAL HIGH (ref 70–99)
Glucose-Capillary: 120 mg/dL — ABNORMAL HIGH (ref 70–99)
Glucose-Capillary: 88 mg/dL (ref 70–99)
Glucose-Capillary: 123 mg/dL — ABNORMAL HIGH (ref 70–99)

## 2019-06-10 MED ORDER — KETOROLAC TROMETHAMINE 15 MG/ML IJ SOLN
15.0000 mg | Freq: Four times a day (QID) | INTRAMUSCULAR | Status: DC | PRN
  Administered 2019-06-10: 22:00:00 15 mg via INTRAVENOUS
  Filled 2019-06-10: qty 1

## 2019-06-10 MED ORDER — MILK AND MOLASSES ENEMA: 1.0000 | Freq: Once | RECTAL | Status: DC

## 2019-06-10 NOTE — Progress Notes (Signed)
  Subjective: Abdominal pain resolved.  No bowel movement or flatus over the last 24 hours.  Objective: Vital signs in last 24 hours: Temp:  [98.2 F (36.8 C)-98.7 F (37.1 C)] 98.7 F (37.1 C) (11/10 0522) Pulse Rate:  [92-97] 97 (11/10 0522) Resp:  [16-18] 16 (11/10 0522) BP: (153-155)/(76-83) 153/83 (11/10 0522) SpO2:  [93 %-100 %] 95 % (11/10 0522) Last BM Date: 06/09/19  Intake/Output from previous day: 11/09 0701 - 11/10 0700 In: 4402.9 [I.V.:3951.8; NG/GT:300; IV Piggyback:151.1] Out: 2200 [Emesis/NG output:2200] Intake/Output this shift: No intake/output data recorded.  General appearance: alert, cooperative and no distress GI: Soft, much less distended.  No point tenderness noted.  No rigidity noted.  Minimal bowel sounds appreciated.  Lab Results:  Recent Labs    06/08/19 0450 06/10/19 0501  WBC 3.2* 4.8  HGB 11.9* 11.3*  HCT 39.9 36.9  PLT 197 212   BMET Recent Labs    06/08/19 0450 06/10/19 0501  NA 142 142  K 3.7 3.7  CL 105 108  CO2 26 27  GLUCOSE 149* 121*  BUN 12 11  CREATININE 0.64 0.59  CALCIUM 8.6* 8.3*   PT/INR No results for input(s): LABPROT, INR in the last 72 hours.  Studies/Results: Dg Abd Acute 2+v W 1v Chest  Result Date: 06/09/2019 CLINICAL DATA:  Abdominal pain. Nasogastric tube placement. EXAM: DG ABDOMEN ACUTE W/ 1V CHEST COMPARISON:  Chest radiograph dated 06/06/2019, abdomen radiograph obtained yesterday and abdomen pelvis CT obtained yesterday. FINDINGS: Very poor inspiration with interval mild bibasilar atelectasis. Otherwise, clear lungs. Normal sized heart. Little change in multiple mildly dilated loops of small bowel in the left abdomen and stool and gas in normal caliber colon. Interval nasogastric tube with its side hole in the mid stomach and tip in the distal stomach. No free peritoneal air. Mild bilateral hip degenerative changes. Thoracic and lumbar spine degenerative changes and mild scoliosis. IMPRESSION: 1. Very  poor inspiration with interval mild bibasilar atelectasis. 2. Stable mild partial small bowel obstruction. 3. Nasogastric tube tip in the distal stomach. Electronically Signed   By: Claudie Revering M.D.   On: 06/09/2019 12:20    Anti-infectives: Anti-infectives (From admission, onward)   None      Assessment/Plan: Impression: Small bowel obstruction.  Now with NG tube decompression. Plan: Continue NG tube decompression for now.  May require oral contrast CT scan in a few days to determine whether this is a mechanical bowel obstruction requiring surgery.  LOS: 4 days    Aviva Signs 06/10/2019

## 2019-06-10 NOTE — Progress Notes (Signed)
Patient Demographics:    Tammy Thompson, is a 63 y.o. female, DOB - [birth year], [id number]  Admit date - 06/06/2019   Admitting Physician Taro Hidrogo Denton Brick, MD  Outpatient Primary MD for the patient is [facility]  LOS - 4  Chief Complaint  Patient presents with   Abdominal Pain        Subjective:    Tammy Thompson today has No chest pain,   -No further fevers,  No further belching  No further nausea  -Tolerated NG tube placement okay--- more than 2 L of gastric contents    Assessment  & Plan :    Principal Problem:   SBO (small bowel obstruction) ([place]) Active Problems:   Type 2 diabetes mellitus without complication ([place])   Cancer ([place])   Essential hypertension   Small bowel obstruction [facility])  Brief Summary 63 y.o. female with a history of colon cancer, history of colon cancer with subtotal right hemicolectomy and ileocolic anastomosis, bladder surgery, diabetes, hypertension, hypothyroidism admitted with abdominal pain, emesis and bowel obstruction presumed secondary to adhesions, NG tube placed 06/09/2019   A/p 1)SBO-secondary to presumed adhesive process--surgical consult appreciated -Poor results after  MiraLAX and a molasses enema. -NG tube placed 06/09/2019 -As needed iV morphine sulfate -As needed Zofran -Per general surgeon -N.p.o. -Follow patient clinically and follow x-rays may need surgical intervention if SBO fails to resolve with conservative measures -Continue Pepcid for GI prophylaxis  2)DM2-no recent A1c available, - hold Amaryl, hold metformin while patient is n.p.o. Allow some permissive Hyperglycemia rather than risk life-threatening hypoglycemia in a patient with unreliable oral intake. Use Novolog/Humalog Sliding scale insulin with Accu-Cheks/Fingersticks as ordered  3)HTN--  may use IV labetalol when necessary  Every 4 hours for systolic blood  pressure over 160 mmhg  4)Fevers- UA does not look suspicious for UTI, no productive cough, chest exam unremarkable, blood cultures NGTD -Chest x-ray without pneumonia type findings -Fevers appears to be resolving, -Hold off on antibiotics, watch respiratory status closely as patient is at risk for aspiration pneumonia given episodes of emesis in the setting of SBO   Disposition/Need for in-Hospital Stay- patient unable to be discharged at this time due to SBO--- requiring IV fluids while n.p.o. with NG tube in situ  Code Status : Full  Family Communication:   NA (patient is alert, awake and coherent)  Disposition Plan  : TBD  Procedures- NG tube placed 06/09/2019  Consults  :  Gen surg  DVT Prophylaxis  :  - Heparin - SCDs   Lab Results  Component Value Date   PLT 212 06/10/2019    Inpatient Medications  Scheduled Meds:  Chlorhexidine Gluconate Cloth  6 each Topical Daily   gabapentin  400 mg Oral QHS   heparin injection (subcutaneous)  5,000 Units Subcutaneous Q8H   insulin aspart  0-5 Units Subcutaneous QHS   insulin aspart  0-9 Units Subcutaneous TID WC   levothyroxine  25 mcg Oral Q0600   milk and molasses  1 enema Rectal Once   nitroGLYCERIN  1 inch Topical Q8H   Continuous Infusions:  sodium chloride 100 mL/hr at 06/10/19 0508   famotidine (PEPCID) IV 20 mg (06/10/19 0846)   PRN Meds:.acetaminophen, labetalol, LORazepam, morphine injection, ondansetron **OR** ondansetron (  ZOFRAN) IV, phenol    Anti-infectives (From admission, onward)   None        Objective:   Vitals:   06/09/19 0811 06/09/19 1614 06/09/19 2132 06/10/19 0522  BP: (!) 161/95 (!) 155/76 (!) 155/82 (!) 153/83  Pulse: 86  92 97  Resp:  18 16 16   Temp:  98.6 F (37 C) 98.2 F (36.8 C) 98.7 F (37.1 C)  TempSrc:  Oral Oral Oral  SpO2:  100% 93% 95%  Weight:      Height:        Wt Readings from Last 3 Encounters:  06/07/19 73.5 kg  10/01/18 71.7 kg  09/27/18 71.7 kg      Intake/Output Summary (Last 24 hours) at 06/10/2019 0926 Last data filed at 06/10/2019 0544 Gross per 24 hour  Intake 4402.94 ml  Output 2200 ml  Net 2202.94 ml   Physical Exam  Gen:- Awake Alert,  In no apparent distress  HEENT:- [place].AT, No sclera icterus Neck-Supple Neck,No JVD,.  Nose- NG tube with gastric contents Lungs-  CTAB , fair symmetrical air movement CV- S1, S2 normal, regular  Abd-diminished bowel sounds,  scar from prior laparotomy noted, abdomen is less tender, less distended, Extremity/Skin:- No  edema, pedal pulses present  Psych-affect is appropriate, oriented x3 Neuro-no new focal deficits, no tremors   Data Review:   Micro Results Recent Results (from the past 240 hour(s))  SARS CORONAVIRUS 2 (TAT 6-24 HRS) Nasopharyngeal Nasopharyngeal Swab     Status: None   Collection Time: 06/06/19  9:17 PM   Specimen: Nasopharyngeal Swab  Result Value Ref Range Status   SARS Coronavirus 2 NEGATIVE NEGATIVE Final    Comment: (NOTE) SARS-CoV-2 target nucleic acids are NOT DETECTED. The SARS-CoV-2 RNA is generally detectable in upper and lower respiratory specimens during the acute phase of infection. Negative results do not preclude SARS-CoV-2 infection, do not rule out co-infections with other pathogens, and should not be used as the sole basis for treatment or other patient management decisions. Negative results must be combined with clinical observations, patient history, and epidemiological information. The expected result is Negative. Fact Sheet for Patients: SugarRoll.be Fact Sheet for Healthcare Providers: https://www.woods-mathews.com/ This test is not yet approved or cleared by the [place] FDA and  has been authorized for detection and/or diagnosis of SARS-CoV-2 by FDA under an Emergency Use Authorization (EUA). This EUA will remain  in effect (meaning this test can be used) for the duration of  the COVID-19 declaration under Section 56 4(b)(1) of the Act, 21 U.S.C. section 360bbb-3(b)(1), unless the authorization is terminated or revoked sooner. Performed at [facility], [place] [address] [place], [place] [address]   MRSA PCR Screening     Status: None   Collection Time: 06/07/19  7:54 AM   Specimen: Nasal Mucosa; Nasopharyngeal  Result Value Ref Range Status   MRSA by PCR NEGATIVE NEGATIVE Final    Comment:        The GeneXpert MRSA Assay (FDA approved for NASAL specimens only), is one component of a comprehensive MRSA colonization surveillance program. It is not intended to diagnose MRSA infection nor to guide or monitor treatment for MRSA infections. Performed at [facility], [address], [place], [place] [address]   Culture, blood (Routine X 2) w Reflex to ID Panel     Status: None (Preliminary result)   Collection Time: 06/08/19 11:56 AM   Specimen: Right Antecubital; Blood  Result Value Ref Range Status  Specimen Description RIGHT ANTECUBITAL  Final   Special Requests   Final    BOTTLES DRAWN AEROBIC AND ANAEROBIC Blood Culture adequate volume   Culture   Final    NO GROWTH 2 DAYS Performed at [facility], [address] [place], [place] [address]    Report Status PENDING  Incomplete  Culture, blood (Routine X 2) w Reflex to ID Panel     Status: None (Preliminary result)   Collection Time: 06/08/19 11:59 AM   Specimen: BLOOD LEFT ARM  Result Value Ref Range Status   Specimen Description BLOOD LEFT ARM  Final   Special Requests   Final    BOTTLES DRAWN AEROBIC AND ANAEROBIC Blood Culture adequate volume   Culture   Final    NO GROWTH 2 DAYS Performed at [facility], [address], [place], [place] [address]    Report Status PENDING  Incomplete    Radiology Reports US Abdomen Complete  Result Date: 06/06/2019 CLINICAL DATA:  Right upper quadrant epigastric and left upper quadrant pain. EXAM: ABDOMEN ULTRASOUND COMPLETE COMPARISON:   02/14/2018 FINDINGS: Gallbladder: No gallstones or wall thickening visualized. No sonographic Murphy sign noted by sonographer. Common bile duct: Diameter: 6 mm, unchanged from prior study. Liver: Mildly heterogeneous echotexture with slight increased echogenicity relative to right kidney. Portal vein is patent on color Doppler imaging with normal direction of blood flow towards the liver. IVC: No abnormality visualized. Pancreas: Visualized portion unremarkable. Spleen: Size and appearance within normal limits. Right Kidney: Length: 10.2 cm. Echogenicity within normal limits. No mass or hydronephrosis visualized. Left Kidney: Length: 11.1 cm. Echogenicity within normal limits. No mass or hydronephrosis visualized. Abdominal aorta: No aneurysm visualized. Other findings: None. IMPRESSION: 1. Hepatic steatosis and mildly coarsened echotexture. Correlate with any clinical or laboratory evidence of liver disease. 2. No acute findings. Electronically Signed   By: Zetta Bills M.D.   On: 06/06/2019 10:11   Ct Abdomen Pelvis W Contrast  Result Date: 06/06/2019 CLINICAL DATA:  Right upper quadrant, epigastric, left upper quadrant and mid to lower abdominal pain since yesterday. Vomiting. History of colon cancer, chemotherapy, hysterectomy, colon and bladder surgery. EXAM: CT ABDOMEN AND PELVIS WITH CONTRAST TECHNIQUE: Multidetector CT imaging of the abdomen and pelvis was performed using the standard protocol following bolus administration of intravenous contrast. CONTRAST:  173mL OMNIPAQUE IOHEXOL 300 MG/ML  SOLN COMPARISON:  02/14/2018 abdominal sonogram. Outside CT abdomen/pelvis from 06/25/2017. FINDINGS: Lower chest: No significant pulmonary nodules or acute consolidative airspace disease. Hepatobiliary: Normal liver size. No liver mass. Normal gallbladder with no radiopaque cholelithiasis. No biliary ductal dilatation. Pancreas: Hypodense 0.6 cm anterior pancreatic head lesion (series 2/image 33), not  definitely seen on prior CT. No additional pancreatic lesions. No pancreatic duct dilation. Spleen: Normal size. No mass. Adrenals/Urinary Tract: Normal adrenals. Normal kidneys with no hydronephrosis and no renal mass. Normal bladder. Stomach/Bowel: Small hiatal hernia. Otherwise normal nondistended stomach. There are postsurgical changes from subtotal right hemicolectomy with ileocolic anastomosis in the right abdomen. There are several mildly dilated small bowel loops throughout the mid to distal small bowel measuring up to 3.7 cm diameter. Mild small bowel wall thickening within a few small bowel loops in the right lower abdomen. The distal small bowel is collapsed. There is a suggestion of a focal small bowel caliber transition in the right lower quadrant (series 2/image 65), without appreciable obstructing mass or associated hernia. Findings are compatible with mechanical mid to distal small bowel obstruction. No definite pneumatosis. Remnant large bowel is  relatively collapsed with no diverticulosis or definite large bowel wall thickening. Vascular/Lymphatic: Atherosclerotic nonaneurysmal abdominal aorta. Patent portal, splenic, hepatic and renal veins. No pathologically enlarged lymph nodes in the abdomen or pelvis. Reproductive: Status post hysterectomy, with no abnormal findings at the vaginal cuff. No adnexal mass. Other: No pneumoperitoneum. Trace free fluid in the pelvis and right pericolic gutter. No focal fluid collection. Musculoskeletal: No aggressive appearing focal osseous lesions. Mild thoracolumbar spondylosis. IMPRESSION: 1. CT findings are compatible with mechanical small-bowel obstruction in the mid to distal small bowel, with suggestion of transition point in the right lower quadrant. Favor adhesions as the cause given postsurgical changes from subtotal right hemicolectomy with ileocolic anastomosis in the right abdomen. 2. Nonspecific mild wall thickening within a few of the mildly dilated  small bowel loops in the right abdomen. No definite pneumatosis. No pneumoperitoneum. Trace free fluid in the pelvis and right abdomen. 3. No lymphadenopathy or other findings of metastatic disease in the abdomen or pelvis. 4. Subcentimeter low-attenuation anterior pancreatic head lesion, not definitely seen on 06/25/2017 outside CT abdomen study. No biliary or pancreatic duct dilation. Suggest short-term outpatient follow-up MRI abdomen without and with IV contrast for further characterization. 5. Small hiatal hernia. 6.  Aortic Atherosclerosis (ICD10-I70.0). Electronically Signed   By: Ilona Sorrel M.D.   On: 06/06/2019 15:45   Dg Chest Portable 1 View  Result Date: 06/06/2019 CLINICAL DATA:  63 year old female with right upper quadrant and epigastric pain EXAM: PORTABLE CHEST 1 VIEW COMPARISON:  05/21/2018 FINDINGS: Cardiomediastinal silhouette unchanged in size and contour. No evidence of central vascular congestion. No pneumothorax or pleural effusion. Similar appearance of coarsened interstitial markings. IMPRESSION: Negative for acute cardiopulmonary disease Electronically Signed   By: Corrie Mckusick D.O.   On: 06/06/2019 08:52   Dg Abd 2 Views  Result Date: 06/08/2019 CLINICAL DATA:  Follow-up CT findings of small bowel obstruction. EXAM: ABDOMEN - 2 VIEW COMPARISON:  CT 06/06/2019 FINDINGS: Examination demonstrates persistent air-filled mildly dilated small bowel loops measuring up to 3.8 cm in diameter. No significant air-fluid levels. No evidence of free peritoneal air. Air is present throughout the colon. Mild degenerate change of the spine and hips. Multiple pelvic phleboliths. IMPRESSION: Persistent air-filled dilated small bowel loops compatible with known small bowel obstruction. Electronically Signed   By: Marin Olp M.D.   On: 06/08/2019 09:24   Dg Abd Acute 2+v W 1v Chest  Result Date: 06/09/2019 CLINICAL DATA:  Abdominal pain. Nasogastric tube placement. EXAM: DG ABDOMEN ACUTE W/ 1V  CHEST COMPARISON:  Chest radiograph dated 06/06/2019, abdomen radiograph obtained yesterday and abdomen pelvis CT obtained yesterday. FINDINGS: Very poor inspiration with interval mild bibasilar atelectasis. Otherwise, clear lungs. Normal sized heart. Little change in multiple mildly dilated loops of small bowel in the left abdomen and stool and gas in normal caliber colon. Interval nasogastric tube with its side hole in the mid stomach and tip in the distal stomach. No free peritoneal air. Mild bilateral hip degenerative changes. Thoracic and lumbar spine degenerative changes and mild scoliosis. IMPRESSION: 1. Very poor inspiration with interval mild bibasilar atelectasis. 2. Stable mild partial small bowel obstruction. 3. Nasogastric tube tip in the distal stomach. Electronically Signed   By: Claudie Revering M.D.   On: 06/09/2019 12:20     CBC Recent Labs  Lab 06/06/19 0905 06/07/19 0546 06/08/19 0450 06/10/19 0501  WBC 9.9 6.1 3.2* 4.8  HGB 13.9 12.2 11.9* 11.3*  HCT 44.9 39.9 39.9 36.9  PLT 229 201 197  212  MCV 95.3 95.9 97.3 96.6  MCH 29.5 29.3 29.0 29.6  MCHC 31.0 30.6 29.8* 30.6  RDW 12.4 12.6 12.5 12.3  LYMPHSABS 0.9  --   --   --   MONOABS 0.3  --   --   --   EOSABS 0.0  --   --   --   BASOSABS 0.0  --   --   --     Chemistries  Recent Labs  Lab 06/06/19 0905 06/07/19 0546 06/08/19 0450 06/10/19 0501  NA 140 142 142 142  K 4.7 3.6 3.7 3.7  CL 97* 106 105 108  CO2 31 27 26 27   GLUCOSE 288* 144* 149* 121*  BUN 13 10 12 11   CREATININE 0.75 0.66 0.64 0.59  CALCIUM 10.0 8.8* 8.6* 8.3*  AST 17  --   --   --   ALT 20  --   --   --   ALKPHOS 55  --   --   --   BILITOT 0.8  --   --   --    ------------------------------------------------------------------------------------------------------------------ No results for input(s): CHOL, HDL, LDLCALC, TRIG, CHOLHDL, LDLDIRECT in the last 72 hours.  Lab Results  Component Value Date   HGBA1C 8.3 (H) 06/07/2019    ------------------------------------------------------------------------------------------------------------------ No results for input(s): TSH, T4TOTAL, T3FREE, THYROIDAB in the last 72 hours.  Invalid input(s): FREET3 ------------------------------------------------------------------------------------------------------------------ No results for input(s): VITAMINB12, FOLATE, FERRITIN, TIBC, IRON, RETICCTPCT in the last 72 hours.  Coagulation profile No results for input(s): INR, PROTIME in the last 168 hours.  No results for input(s): DDIMER in the last 72 hours.  Cardiac Enzymes No results for input(s): CKMB, TROPONINI, MYOGLOBIN in the last 168 hours.  Invalid input(s): CK ------------------------------------------------------------------------------------------------------------------ No results found for: BNP   Roxan Hockey M.D on 06/10/2019 at 9:26 AM  Go to [email or web address] - for contact info  [facility] - Office  [phone]

## 2019-06-10 NOTE — Progress Notes (Signed)
Pt was resting with husband at bedside. She did not have any complaints of pain or nausea. Will continue to monitor.

## 2019-06-11 ENCOUNTER — Inpatient Hospital Stay (HOSPITAL_COMMUNITY): Payer: No Typology Code available for payment source

## 2019-06-11 DIAGNOSIS — K219 Gastro-esophageal reflux disease without esophagitis: Secondary | ICD-10-CM

## 2019-06-11 DIAGNOSIS — E119 Type 2 diabetes mellitus without complications: Secondary | ICD-10-CM

## 2019-06-11 DIAGNOSIS — I10 Essential (primary) hypertension: Secondary | ICD-10-CM

## 2019-06-11 LAB — BASIC METABOLIC PANEL
Anion gap: 11 (ref 5–15)
BUN: 8 mg/dL (ref 8–23)
CO2: 25 mmol/L (ref 22–32)
Calcium: 8.2 mg/dL — ABNORMAL LOW (ref 8.9–10.3)
Chloride: 104 mmol/L (ref 98–111)
Creatinine, Ser: 0.59 mg/dL (ref 0.44–1.00)
GFR calc Af Amer: >60 mL/min (ref >60)
GFR calc non Af Amer: >60 mL/min (ref >60)
Glucose, Bld: 106 mg/dL — ABNORMAL HIGH (ref 70–99)
Potassium: 3.5 mmol/L (ref 3.5–5.1)
Sodium: 140 mmol/L (ref 135–145)

## 2019-06-11 LAB — GLUCOSE, CAPILLARY
Glucose-Capillary: 96 mg/dL (ref 70–99)
Glucose-Capillary: 130 mg/dL — ABNORMAL HIGH (ref 70–99)
Glucose-Capillary: 107 mg/dL — ABNORMAL HIGH (ref 70–99)
Glucose-Capillary: 124 mg/dL — ABNORMAL HIGH (ref 70–99)

## 2019-06-11 MED ORDER — POLYETHYLENE GLYCOL 3350 17 G PO PACK
17.0000 g | PACK | Freq: Once | ORAL | Status: AC
  Administered 2019-06-11: 21:00:00 17 g via ORAL
  Filled 2019-06-11: qty 1

## 2019-06-11 MED ORDER — POLYETHYLENE GLYCOL 3350 17 G PO PACK
17.0000 g | PACK | Freq: Every day | ORAL | Status: DC
  Administered 2019-06-11 – 2019-06-12 (×2): 17 g via ORAL
  Filled 2019-06-11 (×2): qty 1

## 2019-06-11 NOTE — Progress Notes (Signed)
  Subjective: Patient had a bowel movement and is passing flatus.  No abdominal pain is noted.  Objective: Vital signs in last 24 hours: Temp:  [98.3 F (36.8 C)-99.3 F (37.4 C)] 98.3 F (36.8 C) (11/11 0538) Pulse Rate:  [86-100] 86 (11/11 0538) Resp:  [16-18] 16 (11/11 0538) BP: (158-165)/(73-86) 161/73 (11/11 0538) SpO2:  [93 %-95 %] 95 % (11/11 0538) Last BM Date: 06/10/19  Intake/Output from previous day: 11/10 0701 - 11/11 0700 In: 2686.2 [I.V.:2386.2; NG/GT:200; IV Piggyback:100] Out: 1400 [Urine:1000; Emesis/NG output:400] Intake/Output this shift: No intake/output data recorded.  General appearance: alert, cooperative and no distress GI: Soft, nontender, nondistended.  Occasional bowel sounds appreciated.  Clear fluid within the NG tube canister.  Lab Results:  Recent Labs    06/10/19 0501  WBC 4.8  HGB 11.3*  HCT 36.9  PLT 212   BMET Recent Labs    06/10/19 0501 06/11/19 0628  NA 142 140  K 3.7 3.5  CL 108 104  CO2 27 25  GLUCOSE 121* 106*  BUN 11 8  CREATININE 0.59 0.59  CALCIUM 8.3* 8.2*   PT/INR No results for input(s): LABPROT, INR in the last 72 hours.  Studies/Results: Dg Abd Acute 2+v W 1v Chest  Result Date: 06/09/2019 CLINICAL DATA:  Abdominal pain. Nasogastric tube placement. EXAM: DG ABDOMEN ACUTE W/ 1V CHEST COMPARISON:  Chest radiograph dated 06/06/2019, abdomen radiograph obtained yesterday and abdomen pelvis CT obtained yesterday. FINDINGS: Very poor inspiration with interval mild bibasilar atelectasis. Otherwise, clear lungs. Normal sized heart. Little change in multiple mildly dilated loops of small bowel in the left abdomen and stool and gas in normal caliber colon. Interval nasogastric tube with its side hole in the mid stomach and tip in the distal stomach. No free peritoneal air. Mild bilateral hip degenerative changes. Thoracic and lumbar spine degenerative changes and mild scoliosis. IMPRESSION: 1. Very poor inspiration with  interval mild bibasilar atelectasis. 2. Stable mild partial small bowel obstruction. 3. Nasogastric tube tip in the distal stomach. Electronically Signed   By: Claudie Revering M.D.   On: 06/09/2019 12:20    Anti-infectives: Anti-infectives (From admission, onward)   None      Assessment/Plan: Impression: Small bowel obstruction, slowly resolving Plan: Will remove NG tube and advance diet as tolerated.  I did encourage the patient to ambulate as able.  MiraLAX has been also ordered.  No need for surgical intervention at this time.  LOS: 5 days    Aviva Signs 06/11/2019

## 2019-06-11 NOTE — Progress Notes (Signed)
Patient Demographics:    Tammy Thompson, is a 63 y.o. female, DOB - [birth year], [id number]  Admit date - 06/06/2019   Admitting Physician Courage Denton Brick, MD  Outpatient Primary MD for the patient is [facility], [facility]  LOS - 5  Chief Complaint  Patient presents with   Abdominal Pain        Subjective:    Tammy Thompson is no having any nausea, no vomiting, no abdominal pain.  She has a bowel movement overnight and her NG tube has been removed successfully.  Tolerating clear liquid diet as per general surgery recommendations.    Assessment  & Plan :    Principal Problem:   SBO (small bowel obstruction) (HCC) Active Problems:   Type 2 diabetes mellitus without complication (HCC)   Cancer (HCC)   Essential hypertension   Small bowel obstruction [facility])  Brief Summary 63 y.o. female with a history of colon cancer, history of colon cancer with subtotal right hemicolectomy and ileocolic anastomosis, bladder surgery, diabetes, hypertension, hypothyroidism admitted with abdominal pain, emesis and bowel obstruction presumed secondary to adhesions, NG tube placed 06/09/2019   A/p 1)SBO-secondary to presumed adhesive process--surgical consult appreciated -Poor results after  MiraLAX and a molasses enema. -NG tube placed 06/09/2019 -As needed iV morphine sulfate -As needed Zofran -Per general surgeon NG tube has been removed patient started on clear liquid diet -Follow patient clinically and follow response to conservative measures. -No surgical intervention needed at this time. -Continue Pepcid for GI prophylaxis  2)DM2-no recent A1c available, -Continue to hold Amaryl, hold metformin while patient is n.p.o. Allow some permissive Hyperglycemia rather than risk life-threatening hypoglycemia in a patient with unreliable oral intake. Use Novolog/Humalog Sliding scale insulin with  Accu-Cheks/Fingersticks as ordered  3)HTN--  may use IV labetalol when necessary  Every 4 hours for systolic blood pressure over 160 mmhg -Vital signs stable overall.  4)Fevers- UA does not look suspicious for UTI, no productive cough, chest exam unremarkable, blood cultures NGTD -Chest x-ray without pneumonia type findings -Fevers appears to be resolved -Hold off on antibiotics, watch respiratory status closely as patient is at risk for aspiration pneumonia given episodes of emesis in the setting of SBO   Disposition/Need for in-Hospital Stay- patient unable to be discharged at this time due to SBO--- requiring IV fluids and is slowly advancing diet after NG tube removal; hopefully discharge home in the next 24-40 hours.  Code Status : Full  Family Communication:   NA (patient is alert, awake and coherent)  Disposition Plan  : TBD  Procedures- NG tube placed 06/09/2019  Consults  :  Gen surg  DVT Prophylaxis  :  - Heparin - SCDs   Lab Results  Component Value Date   PLT 212 06/10/2019    Inpatient Medications  Scheduled Meds:  Chlorhexidine Gluconate Cloth  6 each Topical Daily   gabapentin  400 mg Oral QHS   heparin injection (subcutaneous)  5,000 Units Subcutaneous Q8H   insulin aspart  0-5 Units Subcutaneous QHS   insulin aspart  0-9 Units Subcutaneous TID WC   levothyroxine  25 mcg Oral Q0600   milk and molasses  1 enema Rectal Once   nitroGLYCERIN  1 inch Topical Q8H   polyethylene glycol  17 g Oral Daily   Continuous Infusions:  sodium chloride 50 mL/hr at 06/11/19 1700   famotidine (PEPCID) IV Stopped (06/11/19 1148)   PRN Meds:.acetaminophen, ketorolac, labetalol, LORazepam, ondansetron **OR** ondansetron (ZOFRAN) IV, phenol    Anti-infectives (From admission, onward)   None        Objective:   Vitals:   06/10/19 1624 06/10/19 2157 06/11/19 0538 06/11/19 1414  BP: (!) 158/85 (!) 165/86 (!) 161/73 (!) 177/85  Pulse: 100 93 86 87  Resp:   18 16 18   Temp:  99.3 F (37.4 C) 98.3 F (36.8 C) 98.8 F (37.1 C)  TempSrc:  Oral Oral Oral  SpO2: 93% 94% 95% 93%  Weight:      Height:        Wt Readings from Last 3 Encounters:  06/07/19 73.5 kg  10/01/18 71.7 kg  09/27/18 71.7 kg     Intake/Output Summary (Last 24 hours) at 06/11/2019 1844 Last data filed at 06/11/2019 1700 Gross per 24 hour  Intake 2824.55 ml  Output 1400 ml  Net 1424.55 ml   Physical Exam General exam: Alert, awake, oriented x 3; in no acute distress.  Status post NG tube removal and so far tolerating clear liquid diet.  Denies abdominal pain.  Positive bowel movement overnight. Respiratory system: Clear to auscultation. Respiratory effort normal. Cardiovascular system:RRR. No murmurs, rubs, gallops. Gastrointestinal system: Abdomen is nondistended, soft and nontender. No organomegaly or masses felt. Normal bowel sounds heard. Central nervous system: Alert and oriented. No focal neurological deficits. Extremities: No C/C/E, +pedal pulses Skin: No rashes, lesions or ulcers Psychiatry: Judgement and insight appear normal. Mood & affect appropriate.       Data Review:   Micro Results Recent Results (from the past 240 hour(s))  SARS CORONAVIRUS 2 (TAT 6-24 HRS) Nasopharyngeal Nasopharyngeal Swab     Status: None   Collection Time: 06/06/19  9:17 PM   Specimen: Nasopharyngeal Swab  Result Value Ref Range Status   SARS Coronavirus 2 NEGATIVE NEGATIVE Final    Comment: (NOTE) SARS-CoV-2 target nucleic acids are NOT DETECTED. The SARS-CoV-2 RNA is generally detectable in upper and lower respiratory specimens during the acute phase of infection. Negative results do not preclude SARS-CoV-2 infection, do not rule out co-infections with other pathogens, and should not be used as the sole basis for treatment or other patient management decisions. Negative results must be combined with clinical observations, patient history, and epidemiological  information. The expected result is Negative. Fact Sheet for Patients: SugarRoll.be Fact Sheet for Healthcare Providers: https://www.woods-mathews.com/ This test is not yet approved or cleared by the [place] FDA and  has been authorized for detection and/or diagnosis of SARS-CoV-2 by FDA under an Emergency Use Authorization (EUA). This EUA will remain  in effect (meaning this test can be used) for the duration of the COVID-19 declaration under Section 56 4(b)(1) of the Act, 21 U.S.C. section 360bbb-3(b)(1), unless the authorization is terminated or revoked sooner. Performed at [facility], [place] [address] [place], [place] [address]   MRSA PCR Screening     Status: None   Collection Time: 06/07/19  7:54 AM   Specimen: Nasal Mucosa; Nasopharyngeal  Result Value Ref Range Status   MRSA by PCR NEGATIVE NEGATIVE Final    Comment:        The GeneXpert MRSA Assay (FDA approved for NASAL specimens only), is one component of a comprehensive MRSA colonization surveillance program. It is not intended to diagnose MRSA infection nor  to guide or monitor treatment for MRSA infections. Performed at [facility], [address] [place], [place] [address]   Culture, blood (Routine X 2) w Reflex to ID Panel     Status: None (Preliminary result)   Collection Time: 06/08/19 11:56 AM   Specimen: Right Antecubital; Blood  Result Value Ref Range Status   Specimen Description RIGHT ANTECUBITAL  Final   Special Requests   Final    BOTTLES DRAWN AEROBIC AND ANAEROBIC Blood Culture adequate volume   Culture   Final    NO GROWTH 3 DAYS Performed at [facility], [address], [place], [place] [address]    Report Status PENDING  Incomplete  Culture, blood (Routine X 2) w Reflex to ID Panel     Status: None (Preliminary result)   Collection Time: 06/08/19 11:59 AM   Specimen: BLOOD LEFT ARM  Result Value Ref Range Status   Specimen Description  BLOOD LEFT ARM  Final   Special Requests   Final    BOTTLES DRAWN AEROBIC AND ANAEROBIC Blood Culture adequate volume   Culture   Final    NO GROWTH 3 DAYS Performed at [facility], [address], [place], [place] [address]    Report Status PENDING  Incomplete    Radiology Reports US Abdomen Complete  Result Date: 06/06/2019 CLINICAL DATA:  Right upper quadrant epigastric and left upper quadrant pain. EXAM: ABDOMEN ULTRASOUND COMPLETE COMPARISON:  02/14/2018 FINDINGS: Gallbladder: No gallstones or wall thickening visualized. No sonographic Murphy sign noted by sonographer. Common bile duct: Diameter: 6 mm, unchanged from prior study. Liver: Mildly heterogeneous echotexture with slight increased echogenicity relative to right kidney. Portal vein is patent on color Doppler imaging with normal direction of blood flow towards the liver. IVC: No abnormality visualized. Pancreas: Visualized portion unremarkable. Spleen: Size and appearance within normal limits. Right Kidney: Length: 10.2 cm. Echogenicity within normal limits. No mass or hydronephrosis visualized. Left Kidney: Length: 11.1 cm. Echogenicity within normal limits. No mass or hydronephrosis visualized. Abdominal aorta: No aneurysm visualized. Other findings: None. IMPRESSION: 1. Hepatic steatosis and mildly coarsened echotexture. Correlate with any clinical or laboratory evidence of liver disease. 2. No acute findings. Electronically Signed   By: Zetta Bills M.D.   On: 06/06/2019 10:11   Ct Abdomen Pelvis W Contrast  Result Date: 06/06/2019 CLINICAL DATA:  Right upper quadrant, epigastric, left upper quadrant and mid to lower abdominal pain since yesterday. Vomiting. History of colon cancer, chemotherapy, hysterectomy, colon and bladder surgery. EXAM: CT ABDOMEN AND PELVIS WITH CONTRAST TECHNIQUE: Multidetector CT imaging of the abdomen and pelvis was performed using the standard protocol following bolus administration of intravenous  contrast. CONTRAST:  168mL OMNIPAQUE IOHEXOL 300 MG/ML  SOLN COMPARISON:  02/14/2018 abdominal sonogram. Outside CT abdomen/pelvis from 06/25/2017. FINDINGS: Lower chest: No significant pulmonary nodules or acute consolidative airspace disease. Hepatobiliary: Normal liver size. No liver mass. Normal gallbladder with no radiopaque cholelithiasis. No biliary ductal dilatation. Pancreas: Hypodense 0.6 cm anterior pancreatic head lesion (series 2/image 33), not definitely seen on prior CT. No additional pancreatic lesions. No pancreatic duct dilation. Spleen: Normal size. No mass. Adrenals/Urinary Tract: Normal adrenals. Normal kidneys with no hydronephrosis and no renal mass. Normal bladder. Stomach/Bowel: Small hiatal hernia. Otherwise normal nondistended stomach. There are postsurgical changes from subtotal right hemicolectomy with ileocolic anastomosis in the right abdomen. There are several mildly dilated small bowel loops throughout the mid to distal small bowel measuring up to 3.7 cm diameter. Mild small bowel wall thickening within  a few small bowel loops in the right lower abdomen. The distal small bowel is collapsed. There is a suggestion of a focal small bowel caliber transition in the right lower quadrant (series 2/image 65), without appreciable obstructing mass or associated hernia. Findings are compatible with mechanical mid to distal small bowel obstruction. No definite pneumatosis. Remnant large bowel is relatively collapsed with no diverticulosis or definite large bowel wall thickening. Vascular/Lymphatic: Atherosclerotic nonaneurysmal abdominal aorta. Patent portal, splenic, hepatic and renal veins. No pathologically enlarged lymph nodes in the abdomen or pelvis. Reproductive: Status post hysterectomy, with no abnormal findings at the vaginal cuff. No adnexal mass. Other: No pneumoperitoneum. Trace free fluid in the pelvis and right pericolic gutter. No focal fluid collection. Musculoskeletal: No  aggressive appearing focal osseous lesions. Mild thoracolumbar spondylosis. IMPRESSION: 1. CT findings are compatible with mechanical small-bowel obstruction in the mid to distal small bowel, with suggestion of transition point in the right lower quadrant. Favor adhesions as the cause given postsurgical changes from subtotal right hemicolectomy with ileocolic anastomosis in the right abdomen. 2. Nonspecific mild wall thickening within a few of the mildly dilated small bowel loops in the right abdomen. No definite pneumatosis. No pneumoperitoneum. Trace free fluid in the pelvis and right abdomen. 3. No lymphadenopathy or other findings of metastatic disease in the abdomen or pelvis. 4. Subcentimeter low-attenuation anterior pancreatic head lesion, not definitely seen on 06/25/2017 outside CT abdomen study. No biliary or pancreatic duct dilation. Suggest short-term outpatient follow-up MRI abdomen without and with IV contrast for further characterization. 5. Small hiatal hernia. 6.  Aortic Atherosclerosis (ICD10-I70.0). Electronically Signed   By: Ilona Sorrel M.D.   On: 06/06/2019 15:45   Dg Chest Portable 1 View  Result Date: 06/06/2019 CLINICAL DATA:  63 year old female with right upper quadrant and epigastric pain EXAM: PORTABLE CHEST 1 VIEW COMPARISON:  05/21/2018 FINDINGS: Cardiomediastinal silhouette unchanged in size and contour. No evidence of central vascular congestion. No pneumothorax or pleural effusion. Similar appearance of coarsened interstitial markings. IMPRESSION: Negative for acute cardiopulmonary disease Electronically Signed   By: Corrie Mckusick D.O.   On: 06/06/2019 08:52   Dg Abd 2 Views  Result Date: 06/08/2019 CLINICAL DATA:  Follow-up CT findings of small bowel obstruction. EXAM: ABDOMEN - 2 VIEW COMPARISON:  CT 06/06/2019 FINDINGS: Examination demonstrates persistent air-filled mildly dilated small bowel loops measuring up to 3.8 cm in diameter. No significant air-fluid levels. No  evidence of free peritoneal air. Air is present throughout the colon. Mild degenerate change of the spine and hips. Multiple pelvic phleboliths. IMPRESSION: Persistent air-filled dilated small bowel loops compatible with known small bowel obstruction. Electronically Signed   By: Marin Olp M.D.   On: 06/08/2019 09:24   Dg Abd Acute 2+v W 1v Chest  Result Date: 06/11/2019 CLINICAL DATA:  Abdominal pain.  History of colon cancer. EXAM: DG ABDOMEN ACUTE W/ 1V CHEST COMPARISON:  None. FINDINGS: Normal cardiomediastinal silhouette. No pneumothorax is noted. Left lung is clear. Small right pleural effusion is noted with probable associated atelectasis. Dilated small bowel loops are noted concerning for ileus or distal small bowel obstruction. No colonic dilatation is noted. Phleboliths are noted in the pelvis. IMPRESSION: Dilated small bowel loops are noted concerning for ileus or distal small bowel obstruction. Small right pleural effusion is noted with probable associated atelectasis. Electronically Signed   By: Marijo Conception M.D.   On: 06/11/2019 10:09   Dg Abd Acute 2+v W 1v Chest  Result Date: 06/09/2019 CLINICAL DATA:  Abdominal pain. Nasogastric tube placement. EXAM: DG ABDOMEN ACUTE W/ 1V CHEST COMPARISON:  Chest radiograph dated 06/06/2019, abdomen radiograph obtained yesterday and abdomen pelvis CT obtained yesterday. FINDINGS: Very poor inspiration with interval mild bibasilar atelectasis. Otherwise, clear lungs. Normal sized heart. Little change in multiple mildly dilated loops of small bowel in the left abdomen and stool and gas in normal caliber colon. Interval nasogastric tube with its side hole in the mid stomach and tip in the distal stomach. No free peritoneal air. Mild bilateral hip degenerative changes. Thoracic and lumbar spine degenerative changes and mild scoliosis. IMPRESSION: 1. Very poor inspiration with interval mild bibasilar atelectasis. 2. Stable mild partial small bowel  obstruction. 3. Nasogastric tube tip in the distal stomach. Electronically Signed   By: Claudie Revering M.D.   On: 06/09/2019 12:20     CBC Recent Labs  Lab 06/06/19 0905 06/07/19 0546 06/08/19 0450 06/10/19 0501  WBC 9.9 6.1 3.2* 4.8  HGB 13.9 12.2 11.9* 11.3*  HCT 44.9 39.9 39.9 36.9  PLT 229 201 197 212  MCV 95.3 95.9 97.3 96.6  MCH 29.5 29.3 29.0 29.6  MCHC 31.0 30.6 29.8* 30.6  RDW 12.4 12.6 12.5 12.3  LYMPHSABS 0.9  --   --   --   MONOABS 0.3  --   --   --   EOSABS 0.0  --   --   --   BASOSABS 0.0  --   --   --     Chemistries  Recent Labs  Lab 06/06/19 0905 06/07/19 0546 06/08/19 0450 06/10/19 0501 06/11/19 0628  NA 140 142 142 142 140  K 4.7 3.6 3.7 3.7 3.5  CL 97* 106 105 108 104  CO2 31 27 26 27 25   GLUCOSE 288* 144* 149* 121* 106*  BUN 13 10 12 11 8   CREATININE 0.75 0.66 0.64 0.59 0.59  CALCIUM 10.0 8.8* 8.6* 8.3* 8.2*  AST 17  --   --   --   --   ALT 20  --   --   --   --   ALKPHOS 55  --   --   --   --   BILITOT 0.8  --   --   --   --    ------------------------------------------------------------------------------------------------------------------ No results for input(s): CHOL, HDL, LDLCALC, TRIG, CHOLHDL, LDLDIRECT in the last 72 hours.  Lab Results  Component Value Date   HGBA1C 8.3 (H) 06/07/2019   ------------------------------------------------------------------------------------------------------------------ No results found for: BNP   Barton Dubois M.D on 06/11/2019 at 6:44 PM  [phone]

## 2019-06-12 DIAGNOSIS — R109 Unspecified abdominal pain: Secondary | ICD-10-CM

## 2019-06-12 DIAGNOSIS — R1031 Right lower quadrant pain: Secondary | ICD-10-CM

## 2019-06-12 LAB — GLUCOSE, CAPILLARY
Glucose-Capillary: 113 mg/dL — ABNORMAL HIGH (ref 70–99)
Glucose-Capillary: 153 mg/dL — ABNORMAL HIGH (ref 70–99)

## 2019-06-12 NOTE — Progress Notes (Signed)
IV removed, patient tolerated well. Reviewed AVS with patient who verbalized understanding.  Patient to be transported to lobby via wheelchair, and transported home by her husband.

## 2019-06-12 NOTE — Progress Notes (Signed)
  Subjective: Patient denies any abdominal pain, nausea, or vomiting.  She has had multiple bowel movements and is passing flatus.  Tolerating full liquid diet well.  Objective: Vital signs in last 24 hours: Temp:  [98.3 F (36.8 C)-98.8 F (37.1 C)] 98.3 F (36.8 C) (11/12 0543) Pulse Rate:  [72-87] 77 (11/12 0543) Resp:  [16-18] 16 (11/12 0543) BP: (135-177)/(67-85) 135/76 (11/12 0543) SpO2:  [93 %-97 %] 93 % (11/12 0543) Last BM Date: 06/11/19  Intake/Output from previous day: 11/11 0701 - 11/12 0700 In: 2125.1 [P.O.:840; I.V.:1185.1; IV Piggyback:100] Out: -  Intake/Output this shift: No intake/output data recorded.  General appearance: alert, cooperative and no distress GI: soft, non-tender; bowel sounds normal; no masses,  no organomegaly  Lab Results:  Recent Labs    06/10/19 0501  WBC 4.8  HGB 11.3*  HCT 36.9  PLT 212   BMET Recent Labs    06/10/19 0501 06/11/19 0628  NA 142 140  K 3.7 3.5  CL 108 104  CO2 27 25  GLUCOSE 121* 106*  BUN 11 8  CREATININE 0.59 0.59  CALCIUM 8.3* 8.2*   PT/INR No results for input(s): LABPROT, INR in the last 72 hours.  Studies/Results: Dg Abd Acute 2+v W 1v Chest  Result Date: 06/11/2019 CLINICAL DATA:  Abdominal pain.  History of colon cancer. EXAM: DG ABDOMEN ACUTE W/ 1V CHEST COMPARISON:  None. FINDINGS: Normal cardiomediastinal silhouette. No pneumothorax is noted. Left lung is clear. Small right pleural effusion is noted with probable associated atelectasis. Dilated small bowel loops are noted concerning for ileus or distal small bowel obstruction. No colonic dilatation is noted. Phleboliths are noted in the pelvis. IMPRESSION: Dilated small bowel loops are noted concerning for ileus or distal small bowel obstruction. Small right pleural effusion is noted with probable associated atelectasis. Electronically Signed   By: Marijo Conception M.D.   On: 06/11/2019 10:09    Anti-infectives: Anti-infectives (From  admission, onward)   None      Assessment/Plan: Impression: Small bowel obstruction, resolved Plan: Okay to advance to a soft diet.  Okay for discharge from surgery standpoint.  Discussed with Dr. Dyann Kief.  LOS: 6 days    Aviva Signs 06/12/2019

## 2019-06-12 NOTE — Discharge Summary (Signed)
Physician Discharge Summary  Tammy Thompson [id number] DOB: [birth year] DOA: 06/06/2019  PCP: [facility]  Admit date: 06/06/2019 Discharge date: 06/12/2019  Time spent: 35 minutes  Recommendations for Outpatient Follow-up:  1. Repeat basic metabolic panel to follow electrolytes and renal function 2. Reassess blood pressure and adjust antihypertensive regimen as needed 3. Close follow-up patient's CBGs and A1c with further adjustment as needed to her hypoglycemic regimen.   Discharge Diagnoses:  Principal Problem:   SBO (small bowel obstruction) (HCC) Active Problems:   Type 2 diabetes mellitus without complication (HCC)   Cancer (HCC)   Essential hypertension   Small bowel obstruction (HCC)   Abdominal pain   Discharge Condition: Stable and improved.  Patient discharged home with instruction to follow-up with PCP in 10 days.  Diet recommendation: Soft modified carbohydrate and heart healthy diet.  Filed Weights   06/06/19 0720 06/07/19 0703  Weight: 72.6 kg 73.5 kg    History of present illness:  As per H&P written by Dr. Nehemiah Settle on 06/06/2019 63 y.o. female with a history of colon cancer, history of colon cancer with subtotal right hemicolectomy and ileocolic anastomosis, bladder surgery, diabetes, hypertension, hypothyroidism.  Patient seen for "abdominal spasms" since yesterday.  Over the past 24 hours she has had multiple episodes of bilious vomiting.  She has been intolerant of oral food and liquids.  No palliating or provoking factors.  She was seen in the [facility] hospital yesterday with a question of a pancreatic cyst, although this was not seen today.  Her symptoms are worsening.  Her abdominal pain is fairly generalized, although worse in the right upper, left upper, and epigastric areas.  Emergency Department Course: CT shows small bowel obstruction.  Her emesis is controlled without placement of NG tube.  Hospital Course:  1)SBO-secondary to presumed  adhesive process--surgical consult appreciated -NG tube placed 06/09/2019 and successfully removed on 06/11/19 -No nausea or vomiting. -Tolerating diet and able to move bowels prior to discharge. -Per general surgeon  continue soft diet and use as needed stool softeners over-the-counter.  2)DM2- -A1C 8.3 -modified carb diet encouraged -resume the use of metformin and glimepiride.  3)HTN-- -patient advised to follow heart healthy diet. -Vital signs stable overall. -Resume the use of ACE inhibitors at discharge. -Still holding diuretics on to follow-up with her PCP to decrease the chances of dehydration and electrolytes and normalities while slowly recovering from recent SBO.  4)Fevers- UA does not look suspicious for UTI, no productive cough, chest exam unremarkable, blood cultures NGTD -Chest x-ray without pneumonia type findings -Fevers appears to be resolved -No antibiotics required.  Procedures:  See below for x-ray reports.  Consultations:  General surgery  Discharge Exam: Vitals:   06/12/19 0308 06/12/19 0543  BP: 140/67 135/76  Pulse: 72 77  Resp:  16  Temp:  98.3 F (36.8 C)  SpO2:  93%   General exam: Alert, awake, oriented x 3; in no acute distress.    36 hours after removal of NG tube; tolerating diet, no nausea, no vomiting.  Patient feels ready for discharge.  Had another bowel movement overnight.   Respiratory system: Clear to auscultation. Respiratory effort normal. Cardiovascular system:RRR. No murmurs, rubs, gallops. Gastrointestinal system: Abdomen is nondistended, soft and only intermittently mildly sore in her right lower quadrant.  No organomegaly or masses felt. Normal bowel sounds heard. Central nervous system: Alert and oriented. No focal neurological deficits. Extremities: No C/C/E, +pedal pulses Skin: No rashes, lesions or ulcers Psychiatry: Judgement and insight appear  normal. Mood & affect appropriate.    Discharge  Instructions   Discharge Instructions    Diet - low sodium heart healthy   Complete by: As directed    Discharge instructions   Complete by: As directed    Maintain adequate hydration Arrange follow-up with PCP in 10 days Please follow soft diet and use as needed stool softeners over-the-counter as recommended by general surgery.     Allergies as of 06/12/2019   No Known Allergies     Medication List    STOP taking these medications   cyclobenzaprine 10 MG tablet Commonly known as: FLEXERIL   HYDROCHLOROTHIAZIDE PO   HYDROcodone-acetaminophen 5-325 MG tablet Commonly known as: NORCO/VICODIN   ibuprofen 600 MG tablet Commonly known as: ADVIL   methocarbamol 500 MG tablet Commonly known as: ROBAXIN   predniSONE 10 MG tablet Commonly known as: DELTASONE     TAKE these medications   acyclovir 200 MG capsule Commonly known as: ZOVIRAX Take 200 mg by mouth daily.   aspirin EC 81 MG tablet Take 81 mg by mouth daily.   BIOTIN PO Take 1 tablet by mouth daily.   gabapentin 400 MG capsule Commonly known as: NEURONTIN Take 400 mg by mouth at bedtime.   glimepiride 4 MG tablet Commonly known as: AMARYL Take 4 mg by mouth daily.   levothyroxine 25 MCG tablet Commonly known as: SYNTHROID Take 25 mcg by mouth daily. NOT SURE OF THE DOSAGE   lisinopril 40 MG tablet Commonly known as: ZESTRIL Take 40 mg by mouth daily.   metFORMIN 500 MG tablet Commonly known as: GLUCOPHAGE Take 1,000 mg by mouth daily. Dose not know the DOSE   multivitamin tablet Take 1 tablet by mouth daily.   vitamin B-12 100 MCG tablet Commonly known as: CYANOCOBALAMIN Take 100 mcg by mouth daily.   Vitamin D 125 MCG (5000 UT) Caps Take 1 capsule by mouth daily.      No Known Allergies Follow-up Information    [facility], [facility] Schedule an appointment as soon as possible for a visit in 10 day(s).   Contact information: [place]  [id number] [phone]           The results of significant diagnostics from this hospitalization (including imaging, microbiology, ancillary and laboratory) are listed below for reference.    Significant Diagnostic Studies: US Abdomen Complete  Result Date: 06/06/2019 CLINICAL DATA:  Right upper quadrant epigastric and left upper quadrant pain. EXAM: ABDOMEN ULTRASOUND COMPLETE COMPARISON:  02/14/2018 FINDINGS: Gallbladder: No gallstones or wall thickening visualized. No sonographic Murphy sign noted by sonographer. Common bile duct: Diameter: 6 mm, unchanged from prior study. Liver: Mildly heterogeneous echotexture with slight increased echogenicity relative to right kidney. Portal vein is patent on color Doppler imaging with normal direction of blood flow towards the liver. IVC: No abnormality visualized. Pancreas: Visualized portion unremarkable. Spleen: Size and appearance within normal limits. Right Kidney: Length: 10.2 cm. Echogenicity within normal limits. No mass or hydronephrosis visualized. Left Kidney: Length: 11.1 cm. Echogenicity within normal limits. No mass or hydronephrosis visualized. Abdominal aorta: No aneurysm visualized. Other findings: None. IMPRESSION: 1. Hepatic steatosis and mildly coarsened echotexture. Correlate with any clinical or laboratory evidence of liver disease. 2. No acute findings. Electronically Signed   By: Zetta Bills M.D.   On: 06/06/2019 10:11   Ct Abdomen Pelvis W Contrast  Result Date: 06/06/2019 CLINICAL DATA:  Right upper quadrant, epigastric, left upper quadrant and mid to lower abdominal pain  since yesterday. Vomiting. History of colon cancer, chemotherapy, hysterectomy, colon and bladder surgery. EXAM: CT ABDOMEN AND PELVIS WITH CONTRAST TECHNIQUE: Multidetector CT imaging of the abdomen and pelvis was performed using the standard protocol following bolus administration of intravenous contrast. CONTRAST:  171mL OMNIPAQUE IOHEXOL 300 MG/ML  SOLN  COMPARISON:  02/14/2018 abdominal sonogram. Outside CT abdomen/pelvis from 06/25/2017. FINDINGS: Lower chest: No significant pulmonary nodules or acute consolidative airspace disease. Hepatobiliary: Normal liver size. No liver mass. Normal gallbladder with no radiopaque cholelithiasis. No biliary ductal dilatation. Pancreas: Hypodense 0.6 cm anterior pancreatic head lesion (series 2/image 33), not definitely seen on prior CT. No additional pancreatic lesions. No pancreatic duct dilation. Spleen: Normal size. No mass. Adrenals/Urinary Tract: Normal adrenals. Normal kidneys with no hydronephrosis and no renal mass. Normal bladder. Stomach/Bowel: Small hiatal hernia. Otherwise normal nondistended stomach. There are postsurgical changes from subtotal right hemicolectomy with ileocolic anastomosis in the right abdomen. There are several mildly dilated small bowel loops throughout the mid to distal small bowel measuring up to 3.7 cm diameter. Mild small bowel wall thickening within a few small bowel loops in the right lower abdomen. The distal small bowel is collapsed. There is a suggestion of a focal small bowel caliber transition in the right lower quadrant (series 2/image 65), without appreciable obstructing mass or associated hernia. Findings are compatible with mechanical mid to distal small bowel obstruction. No definite pneumatosis. Remnant large bowel is relatively collapsed with no diverticulosis or definite large bowel wall thickening. Vascular/Lymphatic: Atherosclerotic nonaneurysmal abdominal aorta. Patent portal, splenic, hepatic and renal veins. No pathologically enlarged lymph nodes in the abdomen or pelvis. Reproductive: Status post hysterectomy, with no abnormal findings at the vaginal cuff. No adnexal mass. Other: No pneumoperitoneum. Trace free fluid in the pelvis and right pericolic gutter. No focal fluid collection. Musculoskeletal: No aggressive appearing focal osseous lesions. Mild thoracolumbar  spondylosis. IMPRESSION: 1. CT findings are compatible with mechanical small-bowel obstruction in the mid to distal small bowel, with suggestion of transition point in the right lower quadrant. Favor adhesions as the cause given postsurgical changes from subtotal right hemicolectomy with ileocolic anastomosis in the right abdomen. 2. Nonspecific mild wall thickening within a few of the mildly dilated small bowel loops in the right abdomen. No definite pneumatosis. No pneumoperitoneum. Trace free fluid in the pelvis and right abdomen. 3. No lymphadenopathy or other findings of metastatic disease in the abdomen or pelvis. 4. Subcentimeter low-attenuation anterior pancreatic head lesion, not definitely seen on 06/25/2017 outside CT abdomen study. No biliary or pancreatic duct dilation. Suggest short-term outpatient follow-up MRI abdomen without and with IV contrast for further characterization. 5. Small hiatal hernia. 6.  Aortic Atherosclerosis (ICD10-I70.0). Electronically Signed   By: Ilona Sorrel M.D.   On: 06/06/2019 15:45   Dg Chest Portable 1 View  Result Date: 06/06/2019 CLINICAL DATA:  63 year old female with right upper quadrant and epigastric pain EXAM: PORTABLE CHEST 1 VIEW COMPARISON:  05/21/2018 FINDINGS: Cardiomediastinal silhouette unchanged in size and contour. No evidence of central vascular congestion. No pneumothorax or pleural effusion. Similar appearance of coarsened interstitial markings. IMPRESSION: Negative for acute cardiopulmonary disease Electronically Signed   By: Corrie Mckusick D.O.   On: 06/06/2019 08:52   Dg Abd 2 Views  Result Date: 06/08/2019 CLINICAL DATA:  Follow-up CT findings of small bowel obstruction. EXAM: ABDOMEN - 2 VIEW COMPARISON:  CT 06/06/2019 FINDINGS: Examination demonstrates persistent air-filled mildly dilated small bowel loops measuring up to 3.8 cm in diameter. No significant air-fluid levels. No  evidence of free peritoneal air. Air is present throughout the  colon. Mild degenerate change of the spine and hips. Multiple pelvic phleboliths. IMPRESSION: Persistent air-filled dilated small bowel loops compatible with known small bowel obstruction. Electronically Signed   By: Marin Olp M.D.   On: 06/08/2019 09:24   Dg Abd Acute 2+v W 1v Chest  Result Date: 06/11/2019 CLINICAL DATA:  Abdominal pain.  History of colon cancer. EXAM: DG ABDOMEN ACUTE W/ 1V CHEST COMPARISON:  None. FINDINGS: Normal cardiomediastinal silhouette. No pneumothorax is noted. Left lung is clear. Small right pleural effusion is noted with probable associated atelectasis. Dilated small bowel loops are noted concerning for ileus or distal small bowel obstruction. No colonic dilatation is noted. Phleboliths are noted in the pelvis. IMPRESSION: Dilated small bowel loops are noted concerning for ileus or distal small bowel obstruction. Small right pleural effusion is noted with probable associated atelectasis. Electronically Signed   By: Marijo Conception M.D.   On: 06/11/2019 10:09   Dg Abd Acute 2+v W 1v Chest  Result Date: 06/09/2019 CLINICAL DATA:  Abdominal pain. Nasogastric tube placement. EXAM: DG ABDOMEN ACUTE W/ 1V CHEST COMPARISON:  Chest radiograph dated 06/06/2019, abdomen radiograph obtained yesterday and abdomen pelvis CT obtained yesterday. FINDINGS: Very poor inspiration with interval mild bibasilar atelectasis. Otherwise, clear lungs. Normal sized heart. Little change in multiple mildly dilated loops of small bowel in the left abdomen and stool and gas in normal caliber colon. Interval nasogastric tube with its side hole in the mid stomach and tip in the distal stomach. No free peritoneal air. Mild bilateral hip degenerative changes. Thoracic and lumbar spine degenerative changes and mild scoliosis. IMPRESSION: 1. Very poor inspiration with interval mild bibasilar atelectasis. 2. Stable mild partial small bowel obstruction. 3. Nasogastric tube tip in the distal stomach.  Electronically Signed   By: Claudie Revering M.D.   On: 06/09/2019 12:20    Microbiology: Recent Results (from the past 240 hour(s))  SARS CORONAVIRUS 2 (TAT 6-24 HRS) Nasopharyngeal Nasopharyngeal Swab     Status: None   Collection Time: 06/06/19  9:17 PM   Specimen: Nasopharyngeal Swab  Result Value Ref Range Status   SARS Coronavirus 2 NEGATIVE NEGATIVE Final    Comment: (NOTE) SARS-CoV-2 target nucleic acids are NOT DETECTED. The SARS-CoV-2 RNA is generally detectable in upper and lower respiratory specimens during the acute phase of infection. Negative results do not preclude SARS-CoV-2 infection, do not rule out co-infections with other pathogens, and should not be used as the sole basis for treatment or other patient management decisions. Negative results must be combined with clinical observations, patient history, and epidemiological information. The expected result is Negative. Fact Sheet for Patients: SugarRoll.be Fact Sheet for Healthcare Providers: https://www.woods-mathews.com/ This test is not yet approved or cleared by the [place] FDA and  has been authorized for detection and/or diagnosis of SARS-CoV-2 by FDA under an Emergency Use Authorization (EUA). This EUA will remain  in effect (meaning this test can be used) for the duration of the COVID-19 declaration under Section 56 4(b)(1) of the Act, 21 U.S.C. section 360bbb-3(b)(1), unless the authorization is terminated or revoked sooner. Performed at [facility], [place] [address], [place], [place] [address]   MRSA PCR Screening     Status: None   Collection Time: 06/07/19  7:54 AM   Specimen: Nasal Mucosa; Nasopharyngeal  Result Value Ref Range Status   MRSA by PCR NEGATIVE NEGATIVE Final    Comment:  The GeneXpert MRSA Assay (FDA approved for NASAL specimens only), is one component of a comprehensive MRSA colonization surveillance program. It is  not intended to diagnose MRSA infection nor to guide or monitor treatment for MRSA infections. Performed at [facility], [address], [place], [place] [address]   Culture, blood (Routine X 2) w Reflex to ID Panel     Status: None (Preliminary result)   Collection Time: 06/08/19 11:56 AM   Specimen: Right Antecubital; Blood  Result Value Ref Range Status   Specimen Description RIGHT ANTECUBITAL  Final   Special Requests   Final    BOTTLES DRAWN AEROBIC AND ANAEROBIC Blood Culture adequate volume   Culture   Final    NO GROWTH 4 DAYS Performed at [facility], [address] [place],  [address]    Report Status PENDING  Incomplete  Culture, blood (Routine X 2) w Reflex to ID Panel     Status: None (Preliminary result)   Collection Time: 06/08/19 11:59 AM   Specimen: BLOOD LEFT ARM  Result Value Ref Range Status   Specimen Description BLOOD LEFT ARM  Final   Special Requests   Final    BOTTLES DRAWN AEROBIC AND ANAEROBIC Blood Culture adequate volume   Culture   Final    NO GROWTH 4 DAYS Performed at [facility], [address], [place],  [address]    Report Status PENDING  Incomplete     Labs: Basic Metabolic Panel: Recent Labs  Lab 06/06/19 0905 06/07/19 0546 06/08/19 0450 06/10/19 0501 06/11/19 0628  NA 140 142 142 142 140  K 4.7 3.6 3.7 3.7 3.5  CL 97* 106 105 108 104  CO2 31 27 26 27 25   GLUCOSE 288* 144* 149* 121* 106*  BUN 13 10 12 11 8   CREATININE 0.75 0.66 0.64 0.59 0.59  CALCIUM 10.0 8.8* 8.6* 8.3* 8.2*   Liver Function Tests: Recent Labs  Lab 06/06/19 0905  AST 17  ALT 20  ALKPHOS 55  BILITOT 0.8  PROT 7.4  ALBUMIN 4.5   Recent Labs  Lab 06/06/19 0905  LIPASE 18   CBC: Recent Labs  Lab 06/06/19 0905 06/07/19 0546 06/08/19 0450 06/10/19 0501  WBC 9.9 6.1 3.2* 4.8  NEUTROABS 8.6*  --   --   --   HGB 13.9 12.2 11.9* 11.3*  HCT 44.9 39.9 39.9 36.9  MCV 95.3 95.9 97.3 96.6  PLT 229 201 197 212   CBG: Recent Labs   Lab 06/11/19 1106 06/11/19 1541 06/11/19 2038 06/12/19 0737 06/12/19 1117  GLUCAP 130* 107* 124* 113* 153*    Signed:  Barton Dubois MD.  [facility] 06/12/2019, 11:19 AM

## 2019-06-13 LAB — CULTURE, BLOOD (ROUTINE X 2)
Culture: NO GROWTH
Culture: NO GROWTH
Special Requests: ADEQUATE
Special Requests: ADEQUATE

## 2020-11-04 ENCOUNTER — Telehealth: Payer: Self-pay | Admitting: Gastroenterology

## 2020-11-04 NOTE — Telephone Encounter (Signed)
Hi Dr. Rush Landmark,  D.O.D  We recived a referral from [place] for patient to have a repeat colonoscopy. Patient had one done back in 2017 I was able to obtain report for review.  Please advise on scheduling  Thank you

## 2020-11-05 NOTE — Telephone Encounter (Signed)
I have sent the information to the schedulers to get colon set up in the [place].
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
## 2020-11-05 NOTE — Telephone Encounter (Signed)
OK for patient to have procedure in [place]. Thank you.  GM

## 2020-11-08 ENCOUNTER — Encounter: Payer: Self-pay | Admitting: Gastroenterology

## 2020-12-06 ENCOUNTER — Encounter (HOSPITAL_COMMUNITY): Payer: Self-pay

## 2020-12-06 ENCOUNTER — Emergency Department (HOSPITAL_COMMUNITY): Payer: No Typology Code available for payment source

## 2020-12-06 ENCOUNTER — Other Ambulatory Visit: Payer: Self-pay

## 2020-12-06 ENCOUNTER — Emergency Department (HOSPITAL_COMMUNITY)
Admission: EM | Admit: 2020-12-06 | Discharge: 2020-12-06 | Disposition: A | Discharge status: Home / Self Care | Payer: No Typology Code available for payment source | Attending: Emergency Medicine | Admitting: Emergency Medicine

## 2020-12-06 DIAGNOSIS — Z7984 Long term (current) use of oral hypoglycemic drugs: Secondary | ICD-10-CM | POA: Insufficient documentation

## 2020-12-06 DIAGNOSIS — Z87891 Personal history of nicotine dependence: Secondary | ICD-10-CM | POA: Diagnosis not present

## 2020-12-06 DIAGNOSIS — I10 Essential (primary) hypertension: Secondary | ICD-10-CM | POA: Diagnosis not present

## 2020-12-06 DIAGNOSIS — R0602 Shortness of breath: Secondary | ICD-10-CM | POA: Insufficient documentation

## 2020-12-06 DIAGNOSIS — M542 Cervicalgia: Secondary | ICD-10-CM | POA: Insufficient documentation

## 2020-12-06 DIAGNOSIS — Z79899 Other long term (current) drug therapy: Secondary | ICD-10-CM | POA: Insufficient documentation

## 2020-12-06 DIAGNOSIS — R0789 Other chest pain: Secondary | ICD-10-CM | POA: Diagnosis not present

## 2020-12-06 DIAGNOSIS — E119 Type 2 diabetes mellitus without complications: Secondary | ICD-10-CM | POA: Diagnosis not present

## 2020-12-06 DIAGNOSIS — Z85038 Personal history of other malignant neoplasm of large intestine: Secondary | ICD-10-CM | POA: Insufficient documentation

## 2020-12-06 DIAGNOSIS — Z7982 Long term (current) use of aspirin: Secondary | ICD-10-CM | POA: Diagnosis not present

## 2020-12-06 LAB — COMPREHENSIVE METABOLIC PANEL
ALT: 26 U/L (ref 0–44)
AST: 20 U/L (ref 15–41)
Albumin: 4.1 g/dL (ref 3.5–5.0)
Alkaline Phosphatase: 48 U/L (ref 38–126)
Anion gap: 11 (ref 5–15)
BUN: 14 mg/dL (ref 8–23)
CO2: 26 mmol/L (ref 22–32)
Calcium: 9.6 mg/dL (ref 8.9–10.3)
Chloride: 101 mmol/L (ref 98–111)
Creatinine, Ser: 0.76 mg/dL (ref 0.44–1.00)
GFR, Estimated: >60 mL/min (ref >60)
Glucose, Bld: 157 mg/dL — ABNORMAL HIGH (ref 70–99)
Potassium: 3.5 mmol/L (ref 3.5–5.1)
Sodium: 138 mmol/L (ref 135–145)
Total Bilirubin: 0.3 mg/dL (ref 0.3–1.2)
Total Protein: 7.3 g/dL (ref 6.5–8.1)

## 2020-12-06 LAB — CBC WITH DIFFERENTIAL/PLATELET
Abs Immature Granulocytes: 0.02 10*3/uL (ref 0.00–0.07)
Basophils Absolute: 0 10*3/uL (ref 0.0–0.1)
Basophils Relative: 1 %
Eosinophils Absolute: 0.5 10*3/uL (ref 0.0–0.5)
Eosinophils Relative: 7 %
HCT: 44.6 % (ref 36.0–46.0)
Hemoglobin: 13.8 g/dL (ref 12.0–15.0)
Immature Granulocytes: 0 %
Lymphocytes Relative: 34 %
Lymphs Abs: 2.3 10*3/uL (ref 0.7–4.0)
MCH: 29.9 pg (ref 26.0–34.0)
MCHC: 30.9 g/dL (ref 30.0–36.0)
MCV: 96.7 fL (ref 80.0–100.0)
Monocytes Absolute: 0.6 10*3/uL (ref 0.1–1.0)
Monocytes Relative: 9 %
Neutro Abs: 3.2 10*3/uL (ref 1.7–7.7)
Neutrophils Relative %: 49 %
Platelets: 204 10*3/uL (ref 150–400)
RBC: 4.61 MIL/uL (ref 3.87–5.11)
RDW: 12.5 % (ref 11.5–15.5)
WBC: 6.5 10*3/uL (ref 4.0–10.5)
nRBC: 0 % (ref 0.0–0.2)

## 2020-12-06 LAB — D-DIMER, QUANTITATIVE: D-Dimer, Quant: 0.29 ug/mL-FEU (ref 0.00–0.50)

## 2020-12-06 LAB — TROPONIN I (HIGH SENSITIVITY)
Troponin I (High Sensitivity): 3 ng/L (ref <18)
Troponin I (High Sensitivity): 4 ng/L (ref <18)

## 2020-12-06 LAB — PROTIME-INR
INR: 1 (ref 0.8–1.2)
Prothrombin Time: 12.7 seconds (ref 11.4–15.2)

## 2020-12-06 LAB — BRAIN NATRIURETIC PEPTIDE: B Natriuretic Peptide: 20 pg/mL (ref 0.0–100.0)

## 2020-12-06 LAB — CBG MONITORING, ED: Glucose-Capillary: 179 mg/dL — ABNORMAL HIGH (ref 70–99)

## 2020-12-06 LAB — MAGNESIUM: Magnesium: 2 mg/dL (ref 1.7–2.4)

## 2020-12-06 MED ORDER — ASPIRIN 81 MG PO CHEW
324.0000 mg | CHEWABLE_TABLET | Freq: Once | ORAL | Status: AC
  Administered 2020-12-06: 324 mg via ORAL
  Filled 2020-12-06: qty 4

## 2020-12-06 NOTE — ED Triage Notes (Signed)
Pt presents to ED with complaints of mid chest pain radiating to mid back, radiating into left neck started couple weeks ago. Pt also with SOB. Pt states pain comes and goes feels like tightness. Pt states pain shoots through left breast.

## 2020-12-06 NOTE — Discharge Instructions (Addendum)
As discussed, your evaluation today has been largely reassuring.  But, it is important that you monitor your condition carefully, and do not hesitate to return to the ED if you develop new, or concerning changes in your condition. ? ?Otherwise, please follow-up with your physician for appropriate ongoing care. ? ?

## 2020-12-06 NOTE — ED Notes (Signed)
Dr Vanita Panda at bedside prior to triage complete for [facility]

## 2020-12-06 NOTE — ED Provider Notes (Signed)
[facility] EMERGENCY DEPARTMENT Provider Note   CSN: [id number] Arrival date & time: 12/06/20  1702     History Chief Complaint  Patient presents with  . Chest Pain    Tammy Thompson is a 65 y.o. female.  HPI Patient presents with chest pressure.  She notes that over the past weeks, possibly longer she has had mid chest pressure, radiating to the back.  More recently she has had new radiation to the left side of her neck, and today had an episode of pain in her left inframammary region. There is associated sporadic shortness of breath, though not necessarily with exertion.  Currently she has no dyspnea. No fever, no vomiting, no abdominal pain.  Patient is a former smoker.  She does have a history of diabetes, hypertension, but states that she is otherwise well.    Past Medical History:  Diagnosis Date  . Cancer ([place])    colon  . Diabetes mellitus   . Hypertension   . Thyroid disease     Patient Active Problem List   Diagnosis Date Noted  . Abdominal pain   . Essential hypertension 06/06/2019  . SBO (small bowel obstruction) ([place]) 06/06/2019  . Small bowel obstruction ([place]) 06/06/2019  . Type 2 diabetes mellitus without complication ([place])   . Cancer [facility])     Past Surgical History:  Procedure Laterality Date  . ABDOMINAL HYSTERECTOMY     Partial*  . BLADDER SURGERY    . COLON SURGERY    . THYROID SURGERY       OB History   No obstetric history on file.     Family History  Problem Relation Age of Onset  . Hypertension Daughter     Social History   Tobacco Use  . Smoking status: Former Smoker    Quit date: 2007    Years since quitting: 15.3  . Smokeless tobacco: Never Used  Substance Use Topics  . Alcohol use: No  . Drug use: Not Currently    Home Medications Prior to Admission medications   Medication Sig Start Date End Date Taking? Authorizing Provider  acyclovir (ZOVIRAX) 200 MG capsule Take 200 mg by mouth daily.   Yes [provider]  aspirin EC 81 MG tablet Take 81 mg by mouth daily.   Yes [provider]  BIOTIN PO Take 1 tablet by mouth daily.   Yes [provider]  capsaicin (ZOSTRIX) 0.025 % cream Apply 1 application topically 2 (two) times daily. 05/27/20  Yes [provider]  Cholecalciferol (VITAMIN D) 125 MCG (5000 UT) CAPS Take 1 capsule by mouth daily.   Yes [provider]  gabapentin (NEURONTIN) 400 MG capsule Take 400 mg by mouth at bedtime.   Yes [provider]  glimepiride (AMARYL) 4 MG tablet Take 4 mg by mouth daily.   Yes [provider]  hydrochlorothiazide (HYDRODIURIL) 25 MG tablet Take 25 mg by mouth daily. 09/27/20  Yes [provider]  levothyroxine (SYNTHROID, LEVOTHROID) 25 MCG tablet Take 25 mcg by mouth daily. NOT SURE OF THE DOSAGE   Yes [provider]  lisinopril (PRINIVIL,ZESTRIL) 40 MG tablet Take 40 mg by mouth daily.   Yes [provider]  metFORMIN (GLUCOPHAGE) 500 MG tablet Take 1,000 mg by mouth daily. Dose not know the DOSE   Yes [provider]  Multiple Vitamin (MULTIVITAMIN) tablet Take 1 tablet by mouth daily. Patient not taking: Reported on 12/06/2020    [provider]  vitamin  B-12 (CYANOCOBALAMIN) 100 MCG tablet Take 100 mcg by mouth daily. Patient not taking: Reported on 12/06/2020    [provider]    Allergies    Patient has no known allergies.  Review of Systems   Review of Systems  Constitutional:       Per HPI, otherwise negative  HENT:       Per HPI, otherwise negative  Respiratory:       Per HPI, otherwise negative  Cardiovascular:       Per HPI, otherwise negative  Gastrointestinal: Negative for vomiting.  Endocrine:       Negative aside from HPI  Genitourinary:       Neg aside from HPI   Musculoskeletal:       Per HPI, otherwise negative  Skin: Negative.   Neurological: Negative for syncope.    Physical Exam Updated Vital Signs BP (!)  152/61   Pulse 75   Temp 98.5 F (36.9 C) (Oral)   Resp 14   Ht 5\' 3"  (1.6 m)   Wt 72.6 kg   SpO2 96%   BMI 28.34 kg/m   Physical Exam Vitals and nursing note reviewed.  Constitutional:      General: She is not in acute distress.    Appearance: She is well-developed.  HENT:     Head: Normocephalic and atraumatic.  Eyes:     Conjunctiva/sclera: Conjunctivae normal.  Cardiovascular:     Rate and Rhythm: Normal rate and regular rhythm.  Pulmonary:     Effort: Pulmonary effort is normal. No accessory muscle usage or respiratory distress.     Breath sounds: No stridor.  Abdominal:     General: There is no distension.  Skin:    General: Skin is warm and dry.  Neurological:     Mental Status: She is alert and oriented to person, place, and time.     Cranial Nerves: No cranial nerve deficit.     ED Results / Procedures / Treatments   Labs (all labs ordered are listed, but only abnormal results are displayed) Labs Reviewed  COMPREHENSIVE METABOLIC PANEL - Abnormal; Notable for the following components:      Result Value   Glucose, Bld 157 (*)    All other components within normal limits  CBG MONITORING, ED - Abnormal; Notable for the following components:   Glucose-Capillary 179 (*)    All other components within normal limits  MAGNESIUM  BRAIN NATRIURETIC PEPTIDE  CBC WITH DIFFERENTIAL/PLATELET  PROTIME-INR  D-DIMER, QUANTITATIVE  TROPONIN I (HIGH SENSITIVITY)  TROPONIN I (HIGH SENSITIVITY)  .  EKG EKG Interpretation  Date/Time:  Monday Dec 06 2020 17:27:35 EDT Ventricular Rate:  73 PR Interval:  163 QRS Duration: 116 QT Interval:  391 QTC Calculation: 431 R Axis:   -3 Text Interpretation: Sinus rhythm Nonspecific intraventricular conduction delay Low voltage, precordial leads Minimal ST elevation, anterolateral leads similar to prior Abnormal ECG Confirmed by Carmin Muskrat [phone]) on 12/06/2020 5:38:55 PM   Radiology DG Chest Portable 1 View  Result  Date: 12/06/2020 CLINICAL DATA:  Mid chest pain. EXAM: PORTABLE CHEST 1 VIEW COMPARISON:  June 06, 2019 FINDINGS: Chronic appearing increased lung markings are seen without evidence of acute infiltrate, pleural effusion or pneumothorax. The heart size and mediastinal contours are within normal limits. Degenerative changes seen throughout the thoracic spine. IMPRESSION: Stable exam without acute or active cardiopulmonary disease. Electronically Signed   By: Virgina Norfolk M.D.   On: 12/06/2020 18:10     Procedures  Procedures   Medications Ordered in ED Medications  aspirin chewable tablet 324 mg (324 mg Oral Given 12/06/20 1731)    ED Course  I have reviewed the triage vital signs and the nursing notes.  Pertinent labs & imaging results that were available during my care of the patient were reviewed by me and considered in my medical decision making (see chart for details).    11:16 PM Patient remains calm.  Heart rate 70s sinus unremarkable pulse ox 98% room air normal She and I discussed remaining labs, notable for second troponin normal, D-dimer normal.  She discussed her ongoing discomfort which has been present for some time, including back pain which did not previously discussed.  She also notes that she recently had an MRI, but is not aware of those results.  However, the patient does note that she has close follow-up with her physician at the [facility] scheduled for later in the week. Here, with 2 normal troponin, nonischemic EKG, reassuring x-ray, normal D-dimer, no evidence for atypical ACS, PE, pneumothorax, pneumonia, or other acute intrathoracic phenomena.  With no hemodynamic instability, arrhythmia on hours of monitoring, patient is appropriate for discharge with outpatient follow-up.  Final Clinical Impression(s) / ED Diagnoses Final diagnoses:  Atypical chest pain    Rx / DC Orders ED Discharge Orders    None       Carmin Muskrat, MD 12/06/20 2317

## 2021-02-02 ENCOUNTER — Encounter: Payer: No Typology Code available for payment source | Admitting: Gastroenterology

## 2021-07-06 ENCOUNTER — Encounter: Payer: Self-pay | Admitting: Internal Medicine

## 2021-10-24 ENCOUNTER — Other Ambulatory Visit (HOSPITAL_COMMUNITY): Payer: Self-pay | Admitting: Physician Assistant

## 2021-10-24 ENCOUNTER — Other Ambulatory Visit: Payer: Self-pay | Admitting: Physician Assistant

## 2021-10-24 DIAGNOSIS — K862 Cyst of pancreas: Secondary | ICD-10-CM

## 2021-11-05 NOTE — Progress Notes (Deleted)
? ?[facility] ?Primary [facility] Physician:  [facility] ?Primary Gastroenterologist:  Dr. Gala Romney ? ?No chief complaint on file. ? ? ?HPI:   ?Tammy Thompson is a 66 y.o. female presenting today at the request of [facility] clinic for consult colonoscopy.  Recommended office visit due to colon surgery. *** ? ?Subtotal right hemicolectomy with ileocolonic anastomosis in the right abdomen. ? ?Past Medical History:  ?Diagnosis Date  ? Cancer [facility])   ? colon  ? Diabetes mellitus   ? Hypertension   ? Thyroid disease   ? ? ?Past Surgical History:  ?Procedure Laterality Date  ? ABDOMINAL HYSTERECTOMY    ? Partial*  ? BLADDER SURGERY    ? COLON SURGERY    ? THYROID SURGERY    ? ? ?Current Outpatient Medications  ?Medication Sig Dispense Refill  ? acyclovir (ZOVIRAX) 200 MG capsule Take 200 mg by mouth daily.    ? aspirin EC 81 MG tablet Take 81 mg by mouth daily.    ? BIOTIN PO Take 1 tablet by mouth daily.    ? capsaicin (ZOSTRIX) 0.025 % cream Apply 1 application topically 2 (two) times daily.    ? Cholecalciferol (VITAMIN D) 125 MCG (5000 UT) CAPS Take 1 capsule by mouth daily.    ? gabapentin (NEURONTIN) 400 MG capsule Take 400 mg by mouth at bedtime.    ? glimepiride (AMARYL) 4 MG tablet Take 4 mg by mouth daily.    ? hydrochlorothiazide (HYDRODIURIL) 25 MG tablet Take 25 mg by mouth daily.    ? levothyroxine (SYNTHROID, LEVOTHROID) 25 MCG tablet Take 25 mcg by mouth daily. NOT SURE OF THE DOSAGE    ? lisinopril (PRINIVIL,ZESTRIL) 40 MG tablet Take 40 mg by mouth daily.    ? metFORMIN (GLUCOPHAGE) 500 MG tablet Take 1,000 mg by mouth daily. Dose not know the DOSE    ? Multiple Vitamin (MULTIVITAMIN) tablet Take 1 tablet by mouth daily. (Patient not taking: Reported on 12/06/2020)    ? vitamin B-12 (CYANOCOBALAMIN) 100 MCG tablet Take 100 mcg by mouth daily. (Patient not taking: Reported on 12/06/2020)    ? ?No current facility-administered medications for this visit.  ? ? ?Allergies as of  11/07/2021  ? (No Known Allergies)  ? ? ?Family History  ?Problem Relation Age of Onset  ? Hypertension Daughter   ? ? ?Social History  ? ?Socioeconomic History  ? Marital status: Married  ?  Spouse name: Not on file  ? Number of children: Not on file  ? Years of education: Not on file  ? Highest education level: Not on file  ?Occupational History  ? Not on file  ?Tobacco Use  ? Smoking status: Former  ?  Types: Cigarettes  ?  Quit date: 2007  ?  Years since quitting: 16.2  ? Smokeless tobacco: Never  ?Substance and Sexual Activity  ? Alcohol use: No  ? Drug use: Not Currently  ? Sexual activity: Not Currently  ?Other Topics Concern  ? Not on file  ?Social History Narrative  ? Not on file  ? ?Social Determinants of Health  ? ?Financial Resource Strain: Not on file  ?Food Insecurity: Not on file  ?Transportation Needs: Not on file  ?Physical Activity: Not on file  ?Stress: Not on file  ?Social Connections: Not on file  ?Intimate Partner Violence: Not on file  ? ? ?Review of Systems: ?Gen: Denies any fever, chills,cold or flulike symptoms, presyncope, syncope.  ?CV: Denies chest pain,  heart palpitations.  ?Resp: Denies shortness of breath or cough. ?GI: See HPI ?GU : Denies urinary burning, urinary frequency, urinary hesitancy ?MS: Denies joint pain. ?Derm: Denies rash. ?Psych: Denies depression. ?Heme: See HPI ? ?Physical Exam: ?There were no vitals taken for this visit. ?General:   Alert and oriented. Pleasant and cooperative. Well-nourished and well-developed.  ?Head:  Normocephalic and atraumatic. ?Eyes:  Without icterus, sclera clear and conjunctiva pink.  ?Ears:  Normal auditory acuity. ?Lungs:  Clear to auscultation bilaterally. No wheezes, rales, or rhonchi. No distress.  ?Heart:  S1, S2 present without murmurs appreciated.  ?Abdomen:  +BS, soft, non-tender and non-distended. No HSM noted. No guarding or rebound. No masses appreciated.  ?Rectal:  Deferred  ?Msk:  Symmetrical without gross deformities. Normal  posture. ?Extremities:  Without edema. ?Neurologic:  Alert and  oriented x4;  grossly normal neurologically. ?Skin:  Intact without significant lesions or rashes. ?Psych: Normal mood and affect. ? ? ? ?Assessment:  ? ? ? ?Plan:  ?*** ? ? ?Aliene Altes, PA-C ?[facility] ?11/07/2021 ?  ?

## 2021-11-07 ENCOUNTER — Encounter: Payer: Self-pay | Admitting: Internal Medicine

## 2021-11-07 ENCOUNTER — Ambulatory Visit: Payer: No Typology Code available for payment source | Admitting: Gastroenterology

## 2021-11-08 ENCOUNTER — Other Ambulatory Visit (HOSPITAL_COMMUNITY): Payer: Self-pay | Admitting: Physician Assistant

## 2021-11-08 ENCOUNTER — Ambulatory Visit (HOSPITAL_COMMUNITY)
Admission: RE | Admit: 2021-11-08 | Discharge: 2021-11-08 | Disposition: A | Discharge status: Home / Self Care | Payer: No Typology Code available for payment source | Source: Ambulatory Visit | Attending: Physician Assistant | Admitting: Physician Assistant

## 2021-11-08 DIAGNOSIS — K862 Cyst of pancreas: Secondary | ICD-10-CM | POA: Insufficient documentation

## 2021-11-08 MED ORDER — GADOBUTROL 1 MMOL/ML IV SOLN
7.0000 mL | Freq: Once | INTRAVENOUS | Status: AC | PRN
  Administered 2021-11-08: 7 mL via INTRAVENOUS

## 2021-12-06 NOTE — Progress Notes (Signed)
? ?Referring Provider: [facility] clinic ?Primary Care Physician:  [facility] ?Primary Gastroenterologist:  Dr. Gala Romney ? ?Chief Complaint  ?Patient presents with  ? Colonoscopy  ? ? ?HPI:   ?Tammy Thompson is a 66 y.o. female presenting today at the request of [facility] clinic for consult colonoscopy.  Recommended office visit due to colon surgery.  ?  ?Reviewed referral information.  States patient has history of colon cancer with right hemicolectomy in 2008.  Last colonoscopy was in 2017 with 1 tubular adenoma, recommended repeat in 5 years.  At the time of her office visit in October 2022, she reported chronic, intermittent, sporadic RUQ abdominal pain that was not related to eating or positional changes.  Symptoms have been present since 2000.  Previous negative gallbladder work-up.  Also apparently had prior EGD in 2014 revealing small hiatal hernia, 10 mm submucosal nodules in the distal esophagus just proximal to the LES with biopsy showing normal mucosa, random gastric biopsies with moderate chronic gastritis, H. pylori positive.  Also with history of pancreatic cyst.  MRCP April 2022 with interval reduction in size of the unilocular cystic lesion in the anterior pancreatic head measuring 6 mm compared to 9 mm in October 2020.  Stated she was following with advanced and GI due to pancreatic lesion.  She was referred to GI for colonoscopy due to [facility] schedule.  Plan for CT A/P with and without contrast to follow-up on RUQ abdominal pain. ? ?Since that time, she had MRI/MRCP with and without contrast in April 2023 revealing stable cystic 6 mm pancreatic head lesion without suspicious MRI features favoring sidebranch IPMN with recommendations to repeat MRI/MRCP in 2 years.  Also with adenomyomatosis of the gallbladder fundus. ? ?Today:  ?Following with GI at the [place]. states she was referred to [place] as they were not able to schedule her for colonoscopy anytime soon.  Reports she is not currently  having any problems.  Denies abdominal pain, constipation, diarrhea, BRBPR, or melena.  She is trying to lose weight. Changing her diet due to diabetes. Rare nocturnal reflux. States she doesn't need anything for this. No dysphagia.  ? ? ?Past Medical History:  ?Diagnosis Date  ? Cancer [facility]) 2008  ? colon  ? Diabetes mellitus   ? Hypertension   ? Sleep apnea   ? Thyroid disease   ? ? ?Past Surgical History:  ?Procedure Laterality Date  ? ABDOMINAL HYSTERECTOMY    ? Partial*  ? BLADDER SURGERY    ? COLON SURGERY    ? right hemicolectomy  ? COLONOSCOPY  2017  ? At [facility]; 1 tubular adenoma, repeat in 5 years  ? ESOPHAGOGASTRODUODENOSCOPY  2014  ? [facility]; small hiatal hernia, 2 mm submucosal nodules in the distal esophagus just proximal to LES with biopsy showing normal mucosa, random gastric biopsy with moderate chronic gastritis, H. pylori positive.  ? THYROID SURGERY    ? ? ?Current Outpatient Medications  ?Medication Sig Dispense Refill  ? acyclovir (ZOVIRAX) 200 MG capsule Take 200 mg by mouth daily.    ? aspirin EC 81 MG tablet Take 81 mg by mouth daily.    ? BIOTIN PO Take 1 tablet by mouth daily.    ? Cholecalciferol (VITAMIN D) 125 MCG (5000 UT) CAPS Take 1 capsule by mouth daily.    ? gabapentin (NEURONTIN) 400 MG capsule Take 400 mg by mouth at bedtime.    ? glimepiride (AMARYL) 4 MG tablet Take 4 mg by  mouth daily.    ? hydrochlorothiazide (HYDRODIURIL) 25 MG tablet Take 25 mg by mouth daily.    ? levothyroxine (SYNTHROID, LEVOTHROID) 25 MCG tablet Take 25 mcg by mouth daily. NOT SURE OF THE DOSAGE    ? lisinopril (PRINIVIL,ZESTRIL) 40 MG tablet Take 40 mg by mouth daily.    ? metFORMIN (GLUCOPHAGE) 500 MG tablet Take 1,000 mg by mouth daily. Dose not know the DOSE    ? Multiple Vitamin (MULTIVITAMIN) tablet Take 1 tablet by mouth daily.    ? ?No current facility-administered medications for this visit.  ? ? ?Allergies as of 12/08/2021  ? (No Known Allergies)  ? ? ?Family History   ?Problem Relation Age of Onset  ? Hypertension Daughter   ? Colon cancer Neg Hx   ? ? ?Social History  ? ?Socioeconomic History  ? Marital status: Married  ?  Spouse name: Not on file  ? Number of children: Not on file  ? Years of education: Not on file  ? Highest education level: Not on file  ?Occupational History  ? Not on file  ?Tobacco Use  ? Smoking status: Former  ?  Types: Cigarettes  ?  Quit date: 2007  ?  Years since quitting: 16.3  ? Smokeless tobacco: Never  ?Substance and Sexual Activity  ? Alcohol use: No  ? Drug use: Not Currently  ? Sexual activity: Not Currently  ?Other Topics Concern  ? Not on file  ?Social History Narrative  ? Not on file  ? ?Social Determinants of Health  ? ?Financial Resource Strain: Not on file  ?Food Insecurity: Not on file  ?Transportation Needs: Not on file  ?Physical Activity: Not on file  ?Stress: Not on file  ?Social Connections: Not on file  ?Intimate Partner Violence: Not on file  ? ? ?Review of Systems: ?Gen: Denies any fever, chills, cold or flu like symptoms, pre-syncope, or syncope.  ?CV: Denies chest pain or heart palpitations. ?Resp: Denies shortness of breath or cough.  ?GI: See HPI ?GU : Denies urinary burning, urinary frequency, urinary hesitancy ?MS: Denies joint pain. ?Derm: Denies rash. ?Psych: Denies depression, anxiety. ?Heme: See HPI ? ?Physical Exam: ?BP 120/72   Pulse 80   Temp (!) 97.5 ?F (36.4 ?C) (Temporal)   Ht '5\' 3"'$  (1.6 m)   Wt 153 lb 3.2 oz (69.5 kg)   BMI 27.14 kg/m?  ?General:   Alert and oriented. Pleasant and cooperative. Well-nourished and well-developed.  ?Head:  Normocephalic and atraumatic. ?Eyes:  Without icterus, sclera clear and conjunctiva pink.  ?Ears:  Normal auditory acuity. ?Lungs:  Clear to auscultation bilaterally. No wheezes, rales, or rhonchi. No distress.  ?Heart:  S1, S2 present without murmurs appreciated.  ?Abdomen:  +BS, soft, non-tender and non-distended. No HSM noted. No guarding or rebound. No masses appreciated.   ?Rectal:  Deferred  ?Msk:  Symmetrical without gross deformities. Normal posture. ?Extremities:  Without edema. ?Neurologic:  Alert and  oriented x4;  grossly normal neurologically. ?Skin:  Intact without significant lesions or rashes. ?Psych:  Normal mood and affect. ? ? ? ?Assessment:  ?66 year old female with history of HTN, HLD, type 2 diabetes, pancreatic IPMN, colon cancer in 2008 s/p right hemicolectomy, adenomatous colon polyps, presenting today at the request of the [place] for surveillance colonoscopy due to their schedule being too far out.  Clinically, she is doing well without any significant GI symptoms.  No alarm symptoms.  No family history of colon cancer.  [facility]  colonoscopy was in 2017 with 1 tubular adenoma, recommended 5-year surveillance. ? ?Pancreatic IPMN: ?Surveillance imaging up-to-date.  Due for repeat MRI/MRCP in 2 years.  Following with GI at the [place]. ? ? ?Plan:  ?Proceed with colonoscopy with propofol by Dr. Gala Romney in near future. The risks, benefits, and alternatives have been discussed with the patient in detail. The patient states understanding and desires to proceed. ?ASA 2 ?BMP at preop ?Separate instructions provided for diabetes medication adjustments. ?Follow-up as needed.  Patient is following with GI at the [place].  ? ? ?Aliene Altes, PA-C ?[facility] ?12/08/2021 ?  ?

## 2021-12-08 ENCOUNTER — Encounter: Payer: Self-pay | Admitting: Gastroenterology

## 2021-12-08 ENCOUNTER — Ambulatory Visit (INDEPENDENT_AMBULATORY_CARE_PROVIDER_SITE_OTHER): Payer: No Typology Code available for payment source | Admitting: Gastroenterology

## 2021-12-08 VITALS — BP 120/72 | HR 80 | Temp 97.5°F | Ht 63.0 in | Wt 153.2 lb

## 2021-12-08 DIAGNOSIS — Z85038 Personal history of other malignant neoplasm of large intestine: Secondary | ICD-10-CM | POA: Insufficient documentation

## 2021-12-08 DIAGNOSIS — Z86010 Personal history of colonic polyps: Secondary | ICD-10-CM

## 2021-12-08 NOTE — Patient Instructions (Signed)
We will arrange for colonoscopy in the near future with Dr. Gala Romney. ?1 day prior to procedure: Take glimepiride as prescribed, take one half dose of metformin (500 mg). ?Day of procedure: Do not take any morning diabetes medications. ? ?We will see you back as needed. ? ?It was very nice meeting you today! ? ?Aliene Altes, PA-C ?[facility] ? ?

## 2021-12-09 ENCOUNTER — Telehealth: Payer: Self-pay | Admitting: *Deleted

## 2021-12-09 MED ORDER — PEG 3350-KCL-NA BICARB-NACL 420 G PO SOLR: ORAL | 0 refills | Status: DC

## 2021-12-09 NOTE — Telephone Encounter (Signed)
CALLED PT. She has been scheduled for TCS with Dr. Gala Romney, ASA 2 on 6/21 ta 10am. She needs rx sent to [facility]. Instructions mailed. ?

## 2021-12-12 ENCOUNTER — Telehealth: Payer: Self-pay

## 2021-12-12 ENCOUNTER — Other Ambulatory Visit: Payer: Self-pay

## 2021-12-12 MED ORDER — PEG 3350-KCL-NA BICARB-NACL 420 G PO SOLR: 4000.0000 mL | ORAL | 0 refills | Status: DC

## 2021-12-12 NOTE — Telephone Encounter (Signed)
Tammy received call that [place] doesn't have Nulytely but have Trilyte. ? ?Trilyte rx sent to pharmacy. ?

## 2022-01-09 ENCOUNTER — Other Ambulatory Visit (HOSPITAL_COMMUNITY)
Admission: RE | Admit: 2022-01-09 | Discharge: 2022-01-09 | Disposition: A | Discharge status: Home / Self Care | Payer: No Typology Code available for payment source | Source: Ambulatory Visit | Attending: Internal Medicine | Admitting: Internal Medicine

## 2022-01-09 DIAGNOSIS — Z86010 Personal history of colonic polyps: Secondary | ICD-10-CM | POA: Diagnosis present

## 2022-01-09 LAB — BASIC METABOLIC PANEL
Anion gap: 8 (ref 5–15)
BUN: 20 mg/dL (ref 8–23)
CO2: 27 mmol/L (ref 22–32)
Calcium: 9.5 mg/dL (ref 8.9–10.3)
Chloride: 106 mmol/L (ref 98–111)
Creatinine, Ser: 0.89 mg/dL (ref 0.44–1.00)
GFR, Estimated: >60 mL/min (ref >60)
Glucose, Bld: 137 mg/dL — ABNORMAL HIGH (ref 70–99)
Potassium: 4.3 mmol/L (ref 3.5–5.1)
Sodium: 141 mmol/L (ref 135–145)

## 2022-01-18 ENCOUNTER — Encounter (HOSPITAL_COMMUNITY)
Admission: RE | Disposition: A | Discharge status: Home / Self Care | Payer: Self-pay | Source: Home / Self Care | Attending: Internal Medicine

## 2022-01-18 ENCOUNTER — Other Ambulatory Visit: Payer: Self-pay

## 2022-01-18 ENCOUNTER — Ambulatory Visit (HOSPITAL_COMMUNITY)
Admission: RE | Admit: 2022-01-18 | Discharge: 2022-01-18 | Disposition: A | Discharge status: Home / Self Care | Payer: No Typology Code available for payment source | Attending: Internal Medicine | Admitting: Internal Medicine

## 2022-01-18 ENCOUNTER — Ambulatory Visit (HOSPITAL_COMMUNITY): Payer: No Typology Code available for payment source | Admitting: Anesthesiology

## 2022-01-18 ENCOUNTER — Ambulatory Visit (HOSPITAL_BASED_OUTPATIENT_CLINIC_OR_DEPARTMENT_OTHER): Payer: No Typology Code available for payment source | Admitting: Anesthesiology

## 2022-01-18 ENCOUNTER — Encounter (HOSPITAL_COMMUNITY): Payer: Self-pay | Admitting: Internal Medicine

## 2022-01-18 DIAGNOSIS — D124 Benign neoplasm of descending colon: Secondary | ICD-10-CM

## 2022-01-18 DIAGNOSIS — R109 Unspecified abdominal pain: Secondary | ICD-10-CM

## 2022-01-18 DIAGNOSIS — K56609 Unspecified intestinal obstruction, unspecified as to partial versus complete obstruction: Secondary | ICD-10-CM

## 2022-01-18 DIAGNOSIS — E119 Type 2 diabetes mellitus without complications: Secondary | ICD-10-CM

## 2022-01-18 DIAGNOSIS — R1084 Generalized abdominal pain: Secondary | ICD-10-CM

## 2022-01-18 DIAGNOSIS — Z86010 Personal history of colonic polyps: Secondary | ICD-10-CM | POA: Insufficient documentation

## 2022-01-18 DIAGNOSIS — I10 Essential (primary) hypertension: Secondary | ICD-10-CM | POA: Diagnosis not present

## 2022-01-18 DIAGNOSIS — Z85038 Personal history of other malignant neoplasm of large intestine: Secondary | ICD-10-CM | POA: Diagnosis not present

## 2022-01-18 DIAGNOSIS — Z79899 Other long term (current) drug therapy: Secondary | ICD-10-CM | POA: Insufficient documentation

## 2022-01-18 DIAGNOSIS — Z08 Encounter for follow-up examination after completed treatment for malignant neoplasm: Secondary | ICD-10-CM

## 2022-01-18 DIAGNOSIS — K635 Polyp of colon: Secondary | ICD-10-CM | POA: Diagnosis not present

## 2022-01-18 DIAGNOSIS — Z860101 Personal history of adenomatous and serrated colon polyps: Secondary | ICD-10-CM

## 2022-01-18 DIAGNOSIS — C801 Malignant (primary) neoplasm, unspecified: Secondary | ICD-10-CM

## 2022-01-18 DIAGNOSIS — Z9049 Acquired absence of other specified parts of digestive tract: Secondary | ICD-10-CM | POA: Insufficient documentation

## 2022-01-18 DIAGNOSIS — Z87891 Personal history of nicotine dependence: Secondary | ICD-10-CM | POA: Insufficient documentation

## 2022-01-18 DIAGNOSIS — E039 Hypothyroidism, unspecified: Secondary | ICD-10-CM | POA: Diagnosis not present

## 2022-01-18 DIAGNOSIS — G4730 Sleep apnea, unspecified: Secondary | ICD-10-CM | POA: Insufficient documentation

## 2022-01-18 DIAGNOSIS — Z1211 Encounter for screening for malignant neoplasm of colon: Secondary | ICD-10-CM | POA: Insufficient documentation

## 2022-01-18 DIAGNOSIS — K6389 Other specified diseases of intestine: Secondary | ICD-10-CM | POA: Diagnosis not present

## 2022-01-18 DIAGNOSIS — Z7984 Long term (current) use of oral hypoglycemic drugs: Secondary | ICD-10-CM | POA: Diagnosis not present

## 2022-01-18 HISTORY — PX: POLYPECTOMY: SHX5525

## 2022-01-18 HISTORY — PX: COLONOSCOPY WITH PROPOFOL: SHX5780

## 2022-01-18 LAB — GLUCOSE, CAPILLARY: Glucose-Capillary: 132 mg/dL — ABNORMAL HIGH (ref 70–99)

## 2022-01-18 SURGERY — COLONOSCOPY WITH PROPOFOL
Anesthesia: General

## 2022-01-18 MED ORDER — PROPOFOL 500 MG/50ML IV EMUL
INTRAVENOUS | Status: DC | PRN
  Administered 2022-01-18: 150 ug/kg/min via INTRAVENOUS

## 2022-01-18 MED ORDER — PROPOFOL 500 MG/50ML IV EMUL
INTRAVENOUS | Status: AC
  Filled 2022-01-18: qty 50

## 2022-01-18 MED ORDER — PROPOFOL 10 MG/ML IV BOLUS
INTRAVENOUS | Status: DC | PRN
  Administered 2022-01-18: 60 mg via INTRAVENOUS

## 2022-01-18 MED ORDER — LACTATED RINGERS IV SOLN: INTRAVENOUS | Status: DC

## 2022-01-18 NOTE — H&P (Signed)
$'@LOGO'w$ @   Primary Care Physician:  [facility], [facility] Primary Gastroenterologist:  Dr. Gala Romney  Pre-Procedure History & Physical: HPI:  Tammy Thompson is a 66 y.o. female here for surveillance colonoscopy.  History of right hemicolectomy colon cancer 2007.  Adenoma removed 2017; here for surveillance colonoscopy.  No GI symptoms at this time.   Past Medical History:  Diagnosis Date   Cancer ([place]) 2008   colon   Diabetes mellitus    Hypertension    Sleep apnea    Thyroid disease     Past Surgical History:  Procedure Laterality Date   ABDOMINAL HYSTERECTOMY     Partial*   BLADDER SURGERY     COLON SURGERY     right hemicolectomy   COLONOSCOPY  2017   At [facility]; 1 tubular adenoma, repeat in 5 years   ESOPHAGOGASTRODUODENOSCOPY  2014   [facility]; small hiatal hernia, 2 mm submucosal nodules in the distal esophagus just proximal to LES with biopsy showing normal mucosa, random gastric biopsy with moderate chronic gastritis, H. pylori positive.   THYROID SURGERY      Prior to Admission medications   Medication Sig Start Date End Date Taking? Authorizing Provider  acyclovir (ZOVIRAX) 200 MG capsule Take 200 mg by mouth in the morning.   Yes [provider]  aspirin EC 81 MG tablet Take 81 mg by mouth 2 (two) times a week.   Yes [provider]  capsaicin (ZOSTRIX) 0.025 % cream Apply 1 Application topically 2 (two) times daily as needed (neuropathy).   Yes [provider]  empagliflozin (JARDIANCE) 25 MG TABS tablet Take 25 mg by mouth in the morning.   Yes [provider]  ezetimibe (ZETIA) 10 MG tablet Take 10 mg by mouth in the morning.   Yes [provider]  glimepiride (AMARYL) 4 MG tablet Take 4 mg by mouth in the morning.   Yes [provider]  hydrochlorothiazide (HYDRODIURIL) 25 MG tablet Take 12.5 mg by mouth in the morning. 09/27/20  Yes [provider]  levothyroxine (SYNTHROID) 125 MCG  tablet Take 125 mcg by mouth daily before breakfast.   Yes [provider]  lisinopril (PRINIVIL,ZESTRIL) 40 MG tablet Take 40 mg by mouth in the morning.   Yes [provider]  metFORMIN (GLUCOPHAGE) 1000 MG tablet Take 1,000 mg by mouth in the morning and at bedtime.   Yes [provider]  Multiple Vitamin (MULTIVITAMIN) tablet Take 1 tablet by mouth 2 (two) times a week.   Yes [provider]  polyethylene glycol-electrolytes (TRILYTE) 420 g solution Take 4,000 mLs by mouth as directed. 12/12/21  Yes Faithlyn Recktenwald, Cristopher Estimable, MD  VITAMIN D PO Take 5,000 Units by mouth in the morning.   Yes [provider]  polyethylene glycol-electrolytes (NULYTELY) 420 g solution As directed 12/09/21   Clavin Ruhlman, Cristopher Estimable, MD    Allergies as of 12/09/2021   (No Known Allergies)    Family History  Problem Relation Age of Onset   Hypertension Daughter    Colon cancer Neg Hx     Social History   Socioeconomic History   Marital status: Married    Spouse name: Not on file   Number of children: Not on file   Years of education: Not on file   Highest education level: Not on file  Occupational History   Not on file  Tobacco Use   Smoking status: Former    Types: Cigarettes    Quit  date: 2007    Years since quitting: 16.4   Smokeless tobacco: Never  Substance and Sexual Activity   Alcohol use: No   Drug use: Not Currently   Sexual activity: Not Currently  Other Topics Concern   Not on file  Social History Narrative   Not on file   Social Determinants of Health   Financial Resource Strain: Not on file  Food Insecurity: Not on file  Transportation Needs: Not on file  Physical Activity: Not on file  Stress: Not on file  Social Connections: Not on file  Intimate Partner Violence: Not on file    Review of Systems: See HPI, otherwise negative ROS  Physical Exam: BP 137/71   Pulse 69   Temp 98.3 F (36.8 C) (Oral)   Ht '5\' 3"'$  (1.6 m)   Wt 69.4 kg    SpO2 97%   BMI 27.10 kg/m  General:   Alert,  Well-developed, well-nourished, pleasant and cooperative in NAD  Lungs:  Clear throughout to auscultation.   No wheezes, crackles, or rhonchi. No acute distress. Heart:  Regular rate and rhythm; no murmurs, clicks, rubs,  or gallops. Abdomen: Non-distended, normal bowel sounds.  Soft and nontender without appreciable mass or hepatosplenomegaly.  Pulses:  Normal pulses noted. Extremities:  Without clubbing or edema.  Impression/Plan: 66 year old lady with a history of colon cancer and colonic adenoma here for surveillance examination.  I will offer the patient a surveillance colonoscopy.  The risks, benefits, limitations, alternatives and imponderables have been reviewed with the patient. Questions have been answered. All parties are agreeable.       Notice: This dictation was prepared with Dragon dictation along with smaller phrase technology. Any transcriptional errors that result from this process are unintentional and may not be corrected upon review.

## 2022-01-18 NOTE — Anesthesia Postprocedure Evaluation (Signed)
Anesthesia Post Note  Patient: Tammy Thompson  Procedure(s) Performed: COLONOSCOPY WITH PROPOFOL POLYPECTOMY  Patient location during evaluation: Phase II Anesthesia Type: General Level of consciousness: awake and alert and oriented Pain management: pain level controlled Vital Signs Assessment: post-procedure vital signs reviewed and stable Respiratory status: spontaneous breathing, nonlabored ventilation and respiratory function stable Cardiovascular status: blood pressure returned to baseline and stable Postop Assessment: no apparent nausea or vomiting Anesthetic complications: no   No notable events documented.   Last Vitals:  Vitals:   01/18/22 0906 01/18/22 1040  BP: 137/71 106/63  Pulse: 69 69  Resp:  14  Temp: 36.8 C 36.6 C  SpO2: 97% 98%    Last Pain:  Vitals:   01/18/22 1043  TempSrc:   PainSc: 0-No pain                 Joliana Claflin C Tifini Reeder

## 2022-01-18 NOTE — Discharge Instructions (Signed)
  Colonoscopy Discharge Instructions  Read the instructions outlined below and refer to this sheet in the next few weeks. These discharge instructions provide you with general information on caring for yourself after you leave the hospital. Your doctor may also give you specific instructions. While your treatment has been planned according to the most current medical practices available, unavoidable complications occasionally occur. If you have any problems or questions after discharge, call Dr. Gala Romney at [phone]. ACTIVITY You may resume your regular activity, but move at a slower pace for the next 24 hours.  Take frequent rest periods for the next 24 hours.  Walking will help get rid of the air and reduce the bloated feeling in your belly (abdomen).  No driving for 24 hours (because of the medicine (anesthesia) used during the test).   Do not sign any important legal documents or operate any machinery for 24 hours (because of the anesthesia used during the test).  NUTRITION Drink plenty of fluids.  You may resume your normal diet as instructed by your doctor.  Begin with a light meal and progress to your normal diet. Heavy or fried foods are harder to digest and may make you feel sick to your stomach (nauseated).  Avoid alcoholic beverages for 24 hours or as instructed.  MEDICATIONS You may resume your normal medications unless your doctor tells you otherwise.  WHAT YOU CAN EXPECT TODAY Some feelings of bloating in the abdomen.  Passage of more gas than usual.  Spotting of blood in your stool or on the toilet paper.  IF YOU HAD POLYPS REMOVED DURING THE COLONOSCOPY: No aspirin products for 7 days or as instructed.  No alcohol for 7 days or as instructed.  Eat a soft diet for the next 24 hours.  FINDING OUT THE RESULTS OF YOUR TEST Not all test results are available during your visit. If your test results are not back during the visit, make an appointment with your caregiver to find out the  results. Do not assume everything is normal if you have not heard from your caregiver or the medical facility. It is important for you to follow up on all of your test results.  SEEK IMMEDIATE MEDICAL ATTENTION IF: You have more than a spotting of blood in your stool.  Your belly is swollen (abdominal distention).  You are nauseated or vomiting.  You have a temperature over 101.  You have abdominal pain or discomfort that is severe or gets worse throughout the day.     2 polyps removed from your colon today.  No sign of recurrent colon cancer  Further recommendations to follow pending review of pathology report  At patient request, I called Fred at [phone]-[phone] rolled to voicemail.  Message left.

## 2022-01-18 NOTE — Op Note (Signed)
[facility] Patient Name: Tammy Thompson Procedure Date: 01/18/2022 10:00 AM MRN: [id number] Date of Birth: May 08, 1956 Attending MD: Norvel Richards , MD CSN: [id number] Age: 66 Admit Type: Outpatient Procedure:                Colonoscopy Indications:              High risk colon cancer surveillance: Personal                            history of colonic polyps, High risk colon cancer                            surveillance: Personal history of colon cancer Providers:                Norvel Richards, MD, Lurline Del, RN, Everardo Pacific, Randa Spike, Technician Referring MD:             Norvel Richards, MD Medicines:                Propofol per Anesthesia Complications:            No immediate complications. Estimated Blood Loss:     Estimated blood loss was minimal. Procedure:                After obtaining informed consent, the colonoscope                            was passed under direct vision. Throughout the                            procedure, the patient's blood pressure, pulse, and                            oxygen saturations were monitored continuously. The                            [id number]) scope was introduced through the                            anus and advanced to the the ileocolonic                            anastomosis. The colonoscopy was performed without                            difficulty. The patient tolerated the procedure                            well. The quality of the bowel preparation was                            adequate. Scope In: 10:17:46 AM Scope Out: 10:34:57 AM Scope Withdrawal Time: 0 hours 7 minutes 19 seconds  Total Procedure Duration: 0 hours 17 minutes  11 seconds  Findings:      The perianal and digital rectal examinations were normal.      Two semi-pedunculated polyps were found in the descending colon and       anastomosis. The polyps were 5 to 6 mm in size. These polyps  were       removed with a cold snare. Resection and retrieval were complete.       Estimated blood loss was minimal.      The exam was otherwise without abnormality on direct and retroflexion       views. Anastomosis appeared normal. No evidence of recurrent tumor. Impression:               - Two 5 to 6 mm polyps in the descending colon and                            at the anastomosis, removed with a cold snare.                            Resected and retrieved.                           - The examination was otherwise normal on direct                            and retroflexion views. Status post prior right                            hemicolectomy. Moderate Sedation:      Moderate (conscious) sedation was personally administered by an       anesthesia professional. The following parameters were monitored: oxygen       saturation, heart rate, blood pressure, respiratory rate, EKG, adequacy       of pulmonary ventilation, and response to care. Recommendation:           - Patient has a contact number available for                            emergencies. The signs and symptoms of potential                            delayed complications were discussed with the                            patient. Return to normal activities tomorrow.                            Written discharge instructions were provided to the                            patient.                           - Resume previous diet.                           - Continue present medications.                           -  Repeat colonoscopy after studies are complete for                            surveillance.                           - Return to GI office (date not yet determined). Procedure Code(s):        --- Professional ---                           [phone], Colonoscopy, flexible; with removal of                            tumor(s), polyp(s), or other lesion(s) by snare                            technique Diagnosis  Code(s):        --- Professional ---                           Z86.010, Personal history of colonic polyps                           Z85.038, Personal history of other malignant                            neoplasm of large intestine                           K63.5, Polyp of colon CPT copyright 2019 [employer]. All rights reserved. The codes documented in this report are preliminary and upon coder review may  be revised to meet current compliance requirements. Cristopher Estimable. Lavonte Palos, MD Norvel Richards, MD 01/18/2022 10:47:28 AM This report has been signed electronically. Number of Addenda: 0

## 2022-01-18 NOTE — Transfer of Care (Signed)
Immediate Anesthesia Transfer of Care Note  Patient: Tammy Thompson  Procedure(s) Performed: COLONOSCOPY WITH PROPOFOL POLYPECTOMY  Patient Location: PACU  Anesthesia Type:General  Level of Consciousness: awake, alert  and oriented  Airway & Oxygen Therapy: Patient Spontanous Breathing  Post-op Assessment: Report given to RN, Post -op Vital signs reviewed and stable and Patient moving all extremities X 4  Post vital signs: Reviewed  Last Vitals:  Vitals Value Taken Time  BP 106/63 01/18/22 1040  Temp 36.6 C 01/18/22 1040  Pulse 69 01/18/22 1040  Resp 14 01/18/22 1040  SpO2 98 % 01/18/22 1040    Last Pain:  Vitals:   01/18/22 1040  TempSrc: Oral  PainSc: 0-No pain      Patients Stated Pain Goal: 6 (57/01/77 9390)  Complications: No notable events documented.

## 2022-01-18 NOTE — Anesthesia Preprocedure Evaluation (Signed)
Anesthesia Evaluation  Patient identified by MRN, date of birth, ID band Patient awake    Reviewed: Allergy & Precautions, NPO status , Patient's Chart, lab work & pertinent test results  History of Anesthesia Complications Negative for: history of anesthetic complications  Airway Mallampati: III  TM Distance: >3 FB Neck ROM: Full    Dental  (+) Dental Advisory Given, Caps   Pulmonary sleep apnea , former smoker,    Pulmonary exam normal breath sounds clear to auscultation       Cardiovascular Exercise Tolerance: Good hypertension, Pt. on medications Normal cardiovascular exam Rhythm:Regular Rate:Normal     Neuro/Psych negative neurological ROS  negative psych ROS   GI/Hepatic Neg liver ROS, Bowel prep,H/o colon cancer   Endo/Other  diabetes, Well Controlled, Type 2, Oral Hypoglycemic AgentsHypothyroidism   Renal/GU negative Renal ROS  negative genitourinary   Musculoskeletal negative musculoskeletal ROS (+)   Abdominal   Peds negative pediatric ROS (+)  Hematology negative hematology ROS (+)   Anesthesia Other Findings   Reproductive/Obstetrics negative OB ROS                             Anesthesia Physical Anesthesia Plan  ASA: 2  Anesthesia Plan: General   Post-op Pain Management: Minimal or no pain anticipated   Induction: Intravenous  PONV Risk Score and Plan: Propofol infusion  Airway Management Planned: Nasal Cannula and Natural Airway  Additional Equipment:   Intra-op Plan:   Post-operative Plan:   Informed Consent: I have reviewed the patients History and Physical, chart, labs and discussed the procedure including the risks, benefits and alternatives for the proposed anesthesia with the patient or authorized representative who has indicated his/her understanding and acceptance.     Dental advisory given  Plan Discussed with: CRNA and Surgeon  Anesthesia  Plan Comments:         Anesthesia Quick Evaluation

## 2022-01-18 NOTE — Progress Notes (Signed)
IV site discontinued, right forearm, site clean, dry and intact, 22gauge catheter tip intact.

## 2022-01-19 ENCOUNTER — Encounter: Payer: Self-pay | Admitting: Internal Medicine

## 2022-01-19 LAB — SURGICAL PATHOLOGY

## 2022-01-24 ENCOUNTER — Encounter (HOSPITAL_COMMUNITY): Payer: Self-pay | Admitting: Internal Medicine

## 2022-07-05 ENCOUNTER — Encounter: Payer: Self-pay | Admitting: Neurology

## 2022-08-03 NOTE — Progress Notes (Signed)
Initial neurology clinic note  SERVICE DATE: 08/10/22  Reason for Evaluation: Consultation requested by [facility] for an opinion regarding numbness of legs and hands. My final recommendations will be communicated back to the requesting physician by way of shared medical record or letter to requesting physician via [place] mail.  HPI: This is Ms. Tammy Thompson, a 67 y.o. right-handed female with a medical history of DM2, HTN, HLD, hypothyroidism, low back pain, OSA, PTSD, depression, and anxiety who presents to neurology clinic with the chief complaint of numbness in legs and hands. The patient is alone today.  Patient has had burning limited to bilateral feet for about 1 year. She has tried gabapentin and capsaicin cream. Neither of these helped. Nervive over the counter supplement has helped some with the burning in the feet. (Ingredients: thiamine 1.2 mg, B6 1.7 mg, B12 2.4 mcg, calcium 27 mg, alpha lipoic acid 600 mg, "herbal blend" 30 mg - tumeric and ginger). She sometimes feels imbalance when walking. She denies falls.  She mentions her big toe nails are splints.  When patient lays on her left side, her entire left side will go numb (arm and leg). This will rarely happen on the right as well. It does not occur if she is not laying on her side. This has been on about 7-8 months.   She denies significant neck or back pain. She denies changes to bowel or bladder.  She does not report any constitutional symptoms like fever, night sweats, anorexia or unintentional weight loss.  EtOH use: No  Restrictive diet? No Family history of neuropathy/myopathy/neurologic disease? No  She has never had an EMG.   MEDICATIONS:  Outpatient Encounter Medications as of 08/10/2022  Medication Sig Note   acyclovir (ZOVIRAX) 200 MG capsule Take 200 mg by mouth in the morning.    aspirin EC 81 MG tablet Take 81 mg by mouth 2 (two) times a week.    empagliflozin (JARDIANCE) 25 MG TABS tablet  Take 25 mg by mouth in the morning.    ezetimibe (ZETIA) 10 MG tablet Take 10 mg by mouth in the morning.    glimepiride (AMARYL) 4 MG tablet Take 4 mg by mouth in the morning.    hydrochlorothiazide (HYDRODIURIL) 25 MG tablet Take 12.5 mg by mouth in the morning.    levothyroxine (SYNTHROID) 125 MCG tablet Take 125 mcg by mouth daily before breakfast.    lisinopril (PRINIVIL,ZESTRIL) 40 MG tablet Take 40 mg by mouth in the morning.    metFORMIN (GLUCOPHAGE) 1000 MG tablet Take 1,000 mg by mouth in the morning and at bedtime. 01/18/2022: Took 1/2 dose    Multiple Vitamin (MULTIVITAMIN) tablet Take 1 tablet by mouth 2 (two) times a week.    polyethylene glycol-electrolytes (TRILYTE) 420 g solution Take 4,000 mLs by mouth as directed.    VITAMIN D PO Take 5,000 Units by mouth in the morning.    capsaicin (ZOSTRIX) 0.025 % cream Apply 1 Application topically 2 (two) times daily as needed (neuropathy). (Patient not taking: Reported on 08/10/2022)    polyethylene glycol-electrolytes (NULYTELY) 420 g solution As directed (Patient not taking: Reported on 08/10/2022)    No facility-administered encounter medications on file as of 08/10/2022.    PAST MEDICAL HISTORY: Past Medical History:  Diagnosis Date   Cancer ([place]) 2008   colon   Diabetes mellitus    Hypertension    Sleep apnea    Thyroid disease     PAST SURGICAL HISTORY: Past Surgical  History:  Procedure Laterality Date   ABDOMINAL HYSTERECTOMY     Partial*   BLADDER SURGERY     COLON SURGERY     right hemicolectomy   COLONOSCOPY  2017   At [facility]; 1 tubular adenoma, repeat in 5 years   COLONOSCOPY WITH PROPOFOL N/A 01/18/2022   Procedure: COLONOSCOPY WITH PROPOFOL;  Surgeon: Daneil Dolin, MD;  [facility];  Service: Endoscopy;  Laterality: N/A;  10:00am   ESOPHAGOGASTRODUODENOSCOPY  2014   [facility]; small hiatal hernia, 2 mm submucosal nodules in the distal esophagus just proximal to LES with biopsy  showing normal mucosa, random gastric biopsy with moderate chronic gastritis, H. pylori positive.   POLYPECTOMY  01/18/2022   Procedure: POLYPECTOMY;  Surgeon: Daneil Dolin, MD;  Location: [facility];  Service: Endoscopy;;  at anastomosis and descending   THYROID SURGERY      ALLERGIES: Allergies  Allergen Reactions   Pravastatin Other (See Comments)    Muscle pain    FAMILY HISTORY: Family History  Problem Relation Age of Onset   Heart failure Mother    Hypertension Daughter    Colon cancer Neg Hx     SOCIAL HISTORY: Social History   Tobacco Use   Smoking status: Former    Types: Cigarettes    Quit date: 2007    Years since quitting: 17.0   Smokeless tobacco: Never  Vaping Use   Vaping Use: Never used  Substance Use Topics   Alcohol use: No   Drug use: Not Currently   Social History   Social History Narrative   Are you right handed or left handed? right   Are you currently employed ? no   What is your current occupation?retired   Do you live at home alone?   Who lives with you? husband   What type of home do you live in: 1 story or 2 story? one    Caffeine one cup daily     OBJECTIVE: PHYSICAL EXAM: BP 124/72   Pulse 77   Ht '5\' 3"'$  (1.6 m)   Wt 154 lb 6.4 oz (70 kg)   SpO2 96%   BMI 27.35 kg/m   General: General appearance: Awake and alert. No distress. Cooperative with exam.  Skin: No obvious rash or jaundice. HEENT: Atraumatic. Anicteric. Lungs: Non-labored breathing on room air  Extremities: No edema. Arthritic changes in bilateral hands.  Psych: Affect appropriate.  Neurological: Mental Status: Alert. Speech fluent. No pseudobulbar affect Cranial Nerves: CNII: No RAPD. Visual fields grossly intact. CNIII, IV, VI: PERRL. No nystagmus. EOMI. CN V: Facial sensation intact bilaterally to fine touch. CN VII: Facial muscles symmetric and strong. CN VIII: Hearing grossly intact bilaterally. CN IX: No hypophonia. CN X: Palate elevates  symmetrically. CN XI: Full strength shoulder shrug bilaterally. CN XII: Tongue protrusion full and midline. No atrophy or fasciculations. No significant dysarthria Motor: Tone is normal. 5/5 in bilateral upper and lower extremities. Reflexes:  Right Left   Bicep 1+ 2+   Tricep 1+ 2+   BrRad 1+ 1+   Knee 2+ 2+ Cross adductors  Ankle Trace 2+    Pathological Reflexes: Babinski: flexor response on right, equivocal on left Hoffman: Positive on left, absent on right Troemner: absent bilaterally  Sensation: Pinprick: Intact in bilateral upper extremities. Diminished to 50% in right foot. Not felt at all in left foot. Left medial lower leg is felt more than left lateral leg. Vibration: Intact except: 7 seconds  in right great toe, 4 seconds in left great toe Proprioception: Intact in right great toe, diminished in left great toe Coordination: Intact finger-to- nose-finger bilaterally. Romberg negative. Gait: Able to rise from chair with arms crossed unassisted. Normal, narrow-based gait. Able to tandem walk. Able to walk on toes and heels.  Lab and Test Review: Internal labs: BMP (01/09/22): significant for elevated glucose (137) CK (05/03/20): 136  External labs: HbA1c: 01/25/22: 7.6 06/26/22: 6.2  Imaging: Lumbar spine xray (10/01/2018): FINDINGS: 5 mm anterolisthesis L4-5 appears degenerative with advanced facet degeneration at L4-5. Remaining alignment normal. Negative for fracture.   No significant disc space narrowing.   IMPRESSION: Degenerative anterolisthesis L4-5 with severe facet degeneration at L4-5.  ASSESSMENT: Tammy Thompson is a 67 y.o. female who presents for evaluation of burning in feet and numbness on left arm and leg. She has a relevant medical history of DM2, HTN, HLD, hypothyroidism, low back pain, OSA, PTSD, depression, and anxiety. Her neurological examination is pertinent for asymmetric reflexes (L > R) and reduced sensation in feet (left > right). Available  diagnostic data is significant for HbA1c of 6.2 (previously 7.6 earlier this year) and lumbar spine xray showed severe facet degeneration at L4-5. Patient's burning pain in feet is likely secondary to neuropathy, likely secondary to diabetes. I will send labs for other potential treatable causes. She has significant asymmetry between the left and right. I suspect she may have cervical and/or lumbar stenosis. Cervical stenosis could explain the left arm and leg symptoms. I will get an MRI to evaluate this. I will get an EMG to evaluate for neuropathy and possible lumbar radiculopathy.  PLAN: -Blood work: B1, B12, IFE -MRI cervical spine wo contrast -EMG: PN protocol (L > R) -Can continue nervive, but alpha lipoic acid 600 mg once or twice daily would likely be as good -Start Cymbalta 30 mg for 1 week, then 60 mg thereafter -Lidocaine cream PRN   -Return to clinic in 6 months  The impression above as well as the plan as outlined below were extensively discussed with the patient who voiced understanding. All questions were answered to their satisfaction.  The patient was counseled on pertinent fall precautions per the printed material provided today, and as noted under the "Patient Instructions" section below.  When available, results of the above investigations and possible further recommendations will be communicated to the patient via telephone/MyChart. Patient to call office if not contacted after expected testing turnaround time.   Total time spent reviewing records, interview, history/exam, documentation, and coordination of care on day of encounter:  65 min   Thank you for allowing me to participate in patient's care.  If I can answer any additional questions, I would be pleased to do so.  Kai Levins, MD   CC: [facility], [facility] [address] [place] [address]  CC: Referring provider: [facility] [address]  [place],  [place] [address]

## 2022-08-10 ENCOUNTER — Ambulatory Visit (INDEPENDENT_AMBULATORY_CARE_PROVIDER_SITE_OTHER): Payer: No Typology Code available for payment source | Admitting: Neurology

## 2022-08-10 ENCOUNTER — Other Ambulatory Visit (INDEPENDENT_AMBULATORY_CARE_PROVIDER_SITE_OTHER): Payer: No Typology Code available for payment source

## 2022-08-10 ENCOUNTER — Encounter: Payer: Self-pay | Admitting: Neurology

## 2022-08-10 VITALS — BP 124/72 | HR 77 | Ht 63.0 in | Wt 154.4 lb

## 2022-08-10 DIAGNOSIS — R292 Abnormal reflex: Secondary | ICD-10-CM

## 2022-08-10 DIAGNOSIS — R200 Anesthesia of skin: Secondary | ICD-10-CM

## 2022-08-10 DIAGNOSIS — R209 Unspecified disturbances of skin sensation: Secondary | ICD-10-CM | POA: Diagnosis not present

## 2022-08-10 DIAGNOSIS — E1142 Type 2 diabetes mellitus with diabetic polyneuropathy: Secondary | ICD-10-CM | POA: Diagnosis not present

## 2022-08-10 DIAGNOSIS — R202 Paresthesia of skin: Secondary | ICD-10-CM

## 2022-08-10 LAB — VITAMIN B12: Vitamin B-12: >1500 pg/mL — ABNORMAL HIGH (ref 211–911)

## 2022-08-10 MED ORDER — DULOXETINE HCL 30 MG PO CPEP: 60.0000 mg | ORAL_CAPSULE | Freq: Every day | ORAL | 3 refills | Status: AC

## 2022-08-10 MED ORDER — DULOXETINE HCL 30 MG PO CPEP: 60.0000 mg | ORAL_CAPSULE | Freq: Every day | ORAL | 3 refills | Status: DC

## 2022-08-10 NOTE — Patient Instructions (Addendum)
I think your symptoms are likely a combination of nerve damage (neuropathy) from diabetes and perhaps pinched spine or nerves in neck or back.  I would like to investigate further with: -Blood work today -MRI of your neck (cervical spine) -Muscle and nerve test called an EMG (see more information below)  For your symptoms: -You can continue Nervive if you would like, but over the counter alpha lipoic acid 600 mg once or twice a day would likely be as good and cheaper. -I am starting a nerve pain medication called Cymbalta. You will take 1 capsule (30 mg) at bedtime for 1 week. If you do not have side effects and need more relief, you can then increase to 2 capsules at bedtime (60 mg) thereafter. -You can also try Lidocaine cream as needed. Apply wear you have pain, tingling, or burning. Wear gloves to prevent your hands being numb. This can be bought over the counter at any drug store or online.   I will be in touch when I have your results.  I would like to see you back in clinic in 6 months.   Please let me know if you have any questions or concerns in the meantime.  The physicians and staff at [facility] are committed to providing excellent care. You may receive a survey requesting feedback about your experience at our office. We strive to receive "very good" responses to the survey questions. If you feel that your experience would prevent you from giving the office a "very good " response, please contact our office to try to remedy the situation. We may be reached at [phone]. Thank you for taking the time out of your busy day to complete the survey.  Kai Levins, MD [facility]  ELECTROMYOGRAM AND NERVE CONDUCTION STUDIES (EMG/NCS) INSTRUCTIONS  How to Prepare The neurologist conducting the EMG will need to know if you have certain medical conditions. Tell the neurologist and other EMG lab personnel if you: Have a pacemaker or any other electrical medical device Take  blood-thinning medications Have hemophilia, a blood-clotting disorder that causes prolonged bleeding Bathing Take a shower or bath shortly before your exam in order to remove oils from your skin. Don't apply lotions or creams before the exam.  What to Expect You'll likely be asked to change into a hospital gown for the procedure and lie down on an examination table. The following explanations can help you understand what will happen during the exam.  Electrodes. The neurologist or a technician places surface electrodes at various locations on your skin depending on where you're experiencing symptoms. Or the neurologist may insert needle electrodes at different sites depending on your symptoms.  Sensations. The electrodes will at times transmit a tiny electrical current that you may feel as a twinge or spasm. The needle electrode may cause discomfort or pain that usually ends shortly after the needle is removed. If you are concerned about discomfort or pain, you may want to talk to the neurologist about taking a short break during the exam.  Instructions. During the needle EMG, the neurologist will assess whether there is any spontaneous electrical activity when the muscle is at rest - activity that isn't present in healthy muscle tissue - and the degree of activity when you slightly contract the muscle.  He or she will give you instructions on resting and contracting a muscle at appropriate times. Depending on what muscles and nerves the neurologist is examining, he or she may ask you to change positions  during the exam.  After your EMG You may experience some temporary, minor bruising where the needle electrode was inserted into your muscle. This bruising should fade within several days. If it persists, contact your primary care doctor.   Preventing Falls at [facility] are common, often dreaded events in the lives of older people. Aside from the obvious injuries and even death that may result, fall  can cause wide-ranging consequences including loss of independence, mental decline, decreased activity and mobility. Younger people are also at risk of falling, especially those with chronic illnesses and fatigue.  Ways to reduce risk for falling Examine diet and medications. Warm foods and alcohol dilate blood vessels, which can lead to dizziness when standing. Sleep aids, antidepressants and pain medications can also increase the likelihood of a fall.  Get a vision exam. Poor vision, cataracts and glaucoma increase the chances of falling.  Check foot gear. Shoes should fit snugly and have a sturdy, nonskid sole and a broad, low heel  Participate in a physician-approved exercise program to build and maintain muscle strength and improve balance and coordination. Programs that use ankle weights or stretch bands are excellent for muscle-strengthening. Water aerobics programs and low-impact Tai Chi programs have also been shown to improve balance and coordination.  Increase vitamin D intake. Vitamin D improves muscle strength and increases the amount of calcium the body is able to absorb and deposit in bones.  How to prevent falls from common hazards Floors - Remove all loose wires, cords, and throw rugs. Minimize clutter. Make sure rugs are anchored and smooth. Keep furniture in its usual place.  Chairs -- Use chairs with straight backs, armrests and firm seats. Add firm cushions to existing pieces to add height.  Bathroom - Install grab bars and non-skid tape in the tub or shower. Use a bathtub transfer bench or a shower chair with a back support Use an elevated toilet seat and/or safety rails to assist standing from a low surface. Do not use towel racks or bathroom tissue holders to help you stand.  Lighting - Make sure halls, stairways, and entrances are well-lit. Install a night light in your bathroom or hallway. Make sure there is a light switch at the top and bottom of the staircase. Turn  lights on if you get up in the middle of the night. Make sure lamps or light switches are within reach of the bed if you have to get up during the night.  Kitchen - Install non-skid rubber mats near the sink and stove. Clean spills immediately. Store frequently used utensils, pots, pans between waist and eye level. This helps prevent reaching and bending. Sit when getting things out of lower cupboards.  Living room/ Bedrooms - Place furniture with wide spaces in between, giving enough room to move around. Establish a route through the living room that gives you something to hold onto as you walk.  Stairs - Make sure treads, rails, and rugs are secure. Install a rail on both sides of the stairs. If stairs are a threat, it might be helpful to arrange most of your activities on the lower level to reduce the number of times you must climb the stairs.  Entrances and doorways - Install metal handles on the walls adjacent to the doorknobs of all doors to make it more secure as you travel through the doorway.  Tips for maintaining balance Keep at least one hand free at all times. Try using a backpack or fanny pack to  hold things rather than carrying them in your hands. Never carry objects in both hands when walking as this interferes with keeping your balance.  Attempt to swing both arms from front to back while walking. This might require a conscious effort if Parkinson's disease has diminished your movement. It will, however, help you to maintain balance and posture, and reduce fatigue.  Consciously lift your feet off of the ground when walking. Shuffling and dragging of the feet is a common culprit in losing your balance.  When trying to navigate turns, use a "U" technique of facing forward and making a wide turn, rather than pivoting sharply.  Try to stand with your feet shoulder-length apart. When your feet are close together for any length of time, you increase your risk of losing your balance and  falling.  Do one thing at a time. Don't try to walk and accomplish another task, such as reading or looking around. The decrease in your automatic reflexes complicates motor function, so the less distraction, the better.  Do not wear rubber or gripping soled shoes, they might "catch" on the floor and cause tripping.  Move slowly when changing positions. Use deliberate, concentrated movements and, if needed, use a grab bar or walking aid. Count 15 seconds between each movement. For example, when rising from a seated position, wait 15 seconds after standing to begin walking.  If balance is a continuous problem, you might want to consider a walking aid such as a cane, walking stick, or walker. Once you've mastered walking with help, you might be ready to try it on your own again.

## 2022-08-17 LAB — IMMUNOFIXATION ELECTROPHORESIS
IgG (Immunoglobin G), Serum: 824 mg/dL (ref 600–1540)
IgM, Serum: 96 mg/dL (ref 50–300)
Immunoglobulin A: 159 mg/dL (ref 70–320)

## 2022-08-17 LAB — VITAMIN B1: Vitamin B1 (Thiamine): 13 nmol/L (ref 8–30)

## 2022-08-22 ENCOUNTER — Telehealth: Payer: Self-pay | Admitting: Anesthesiology

## 2022-08-22 NOTE — Telephone Encounter (Signed)
Pt left message with AN stating she started taking Duloxetine around 08/19/22, but is causing her to have diarrhea. She is also nauseous and has slight headache. Pt spoke with Nurse on call. Complete note on  Dr [employer] box.

## 2022-08-23 ENCOUNTER — Telehealth: Payer: Self-pay

## 2022-08-23 NOTE — Telephone Encounter (Signed)
Already called patient and documented in another phone encounter.

## 2022-08-23 NOTE — Telephone Encounter (Signed)
Returned patient's call. She just started taking Cymbalta 30 mg daily a few days ago and noticed diarrhea. We discussed that this could improve. Patient feels like it may have already helped some with her pain. As a result, she will continue the medication for now, but not increase to 60 mg after 1 week as planned. She will call if symptoms or side effects worsen or does not resolve.  All questions were answered.  Kai Levins, MD [facility]

## 2022-09-04 ENCOUNTER — Ambulatory Visit
Admission: RE | Admit: 2022-09-04 | Discharge: 2022-09-04 | Disposition: A | Discharge status: Home / Self Care | Payer: No Typology Code available for payment source | Source: Ambulatory Visit | Attending: Neurology | Admitting: Neurology

## 2022-09-04 DIAGNOSIS — R292 Abnormal reflex: Secondary | ICD-10-CM

## 2022-09-04 DIAGNOSIS — E1142 Type 2 diabetes mellitus with diabetic polyneuropathy: Secondary | ICD-10-CM

## 2022-09-04 DIAGNOSIS — R200 Anesthesia of skin: Secondary | ICD-10-CM

## 2022-09-04 DIAGNOSIS — R209 Unspecified disturbances of skin sensation: Secondary | ICD-10-CM

## 2022-09-06 ENCOUNTER — Encounter: Payer: Self-pay | Admitting: Neurology

## 2022-09-11 ENCOUNTER — Ambulatory Visit (INDEPENDENT_AMBULATORY_CARE_PROVIDER_SITE_OTHER): Payer: No Typology Code available for payment source | Admitting: Neurology

## 2022-09-11 ENCOUNTER — Telehealth: Payer: Self-pay | Admitting: Neurology

## 2022-09-11 DIAGNOSIS — R209 Unspecified disturbances of skin sensation: Secondary | ICD-10-CM

## 2022-09-11 DIAGNOSIS — M5412 Radiculopathy, cervical region: Secondary | ICD-10-CM

## 2022-09-11 DIAGNOSIS — G5603 Carpal tunnel syndrome, bilateral upper limbs: Secondary | ICD-10-CM

## 2022-09-11 DIAGNOSIS — E1142 Type 2 diabetes mellitus with diabetic polyneuropathy: Secondary | ICD-10-CM

## 2022-09-11 DIAGNOSIS — R292 Abnormal reflex: Secondary | ICD-10-CM

## 2022-09-11 DIAGNOSIS — R200 Anesthesia of skin: Secondary | ICD-10-CM

## 2022-09-11 DIAGNOSIS — M5416 Radiculopathy, lumbar region: Secondary | ICD-10-CM

## 2022-09-11 NOTE — Telephone Encounter (Signed)
Discussed the results of patient's EMG after the procedure today. It showed the residuals of an left C8 and left L5 radiculopathy and bilateral carpal tunnel syndrome. There was no evidence of neuropathy.  I discussed wrist splints for carpal tunnel and patient has been referred to physical therapy for radiculopathy.  All questions were answered.  Kai Levins, MD [facility]

## 2022-09-11 NOTE — Procedures (Addendum)
[facility]  [address]  [place], [place] [address] Tel: [phone] Fax: [phone] Test Date:  09/11/2022  Patient: Tammy Thompson DOB: 11/15/1955 Physician: Kai Levins, MD  Sex: Female Height: 5' 3"$  Ref Phys: Kai Levins, MD  ID#: [id number]   Technician:    History: This is a 67 year old female with burning in feet and numbness in left arm and leg.  NCV & EMG Findings: Extensive electrodiagnostic evaluation of the left upper and lower limb with additional nerve conduction studies of the right upper limb shows: Bilateral median sensory responses show prolonged distal peak latency (L5.2, R4.1 ms) and reduced amplitude (L8, R8 V). Left sural, superficial peroneal/fibular, ulnar, and radial sensory responses are within normal limits. Bilateral median (APB) motor responses show prolonged distal onset latency (L5.3, R4.1 ms). Left H reflex latency is within normal limits. Chronic motor axon loss changes without accompanying active denervation changes are seen in the left tibialis anterior, flexor digitorum longus, gluteus medius, L5 lumbosacral paraspinal, first dorsal interosseous, and extensor indicis proprius muscles.  Impression: This is an abnormal study. The findings are most consistent with the following: The residuals of an old intraspinal canal lesion (ie motor radiculopathy) at the left L5 root, mild to moderate in degree electrically. The residuals of an old intraspinal canal lesion (ie motor radiculopathy) at the left C8 root, mild in degree electrically. Evidence of bilateral median mononeuropathy at or distal to the wrist, consistent with carpal tunnel syndrome, moderate in degree electrically on the left and mild in degree electrically on the right. No electrodiagnostic evidence of a large fiber sensorimotor neuropathy.    ___________________________ Kai Levins, MD    Nerve Conduction Studies Motor Nerve Results    Latency Amplitude  F-Lat Segment Distance CV Comment  Site (ms) Norm (mV) Norm (ms)  (cm) (m/s) Norm   Left Fibular (EDB) Motor  Ankle 3.8  < 6.0 4.7  > 2.5        Bel fib head 10.5 - 3.9 -  Bel fib head-Ankle 31 46  > 40   Pop fossa 12.4 - 3.8 -  Pop fossa-Bel fib head 8 42 -   Left Median (APB) Motor  Wrist *5.3  < 4.0 7.9  > 5.0        Elbow 10.4 - 7.0 -  Elbow-Wrist 27 53  > 50   Right Median (APB) Motor  Wrist *4.1  < 4.0 9.4  > 5.0        Left Tibial (AH) Motor  Ankle 4.6  < 6.0 13.2  > 4.0        Knee 13.5 - 8.5 -  Knee-Ankle 40 45  > 40   Left Ulnar (ADM) Motor  Wrist 2.1  < 3.1 10.0  > 7.0        Bel elbow 6.1 - 8.6 -  Bel elbow-Wrist 22 55  > 50   Ab elbow 8.1 - 8.0 -  Ab elbow-Bel elbow 10 50 -    Sensory Sites    Neg Peak Lat Amplitude (O-P) Segment Distance Velocity Comment  Site (ms) Norm (V) Norm  (cm) (ms)   Left Median Sensory  Wrist-Dig II *5.2  < 3.8 *8  > 10 Wrist-Dig II 13    Right Median Sensory  Wrist-Dig II *4.1  < 3.8 *8  > 10 Wrist-Dig II 13    Left Radial Sensory  Forearm-Wrist 2.5  < 2.8 23  > 10 Forearm-Wrist 10  Left Superficial Fibular Sensory  14 cm-Ankle 3.4  < 4.6 6  > 3 14 cm-Ankle 14    Left Sural Sensory  Calf-Lat mall 4.2  < 4.6 7  > 3 Calf-Lat mall 14    Left Ulnar Sensory  Wrist-Dig V 2.9  < 3.2 13  > 5 Wrist-Dig V 11     H-Reflex Results    M-Lat H Lat H Neg Amp H-M Lat  Site (ms) (ms) Norm (mV) (ms)  Left Tibial H-Reflex  Pop fossa 7.4 34.2  < 35.0  26.8   Electromyography   Side Muscle Ins.Act Fibs Fasc Recrt Amp Dur Poly Activation Comment  Left Tib ant Nml Nml Nml *1- *1+ *1+ *1+ Nml N/A  Left Gastroc MH Nml Nml Nml Nml Nml Nml Nml Nml N/A  Left FDL Nml Nml Nml *1- *1+ *1+ Nml Nml N/A  Left Rectus fem Nml Nml Nml Nml Nml Nml Nml Nml N/A  Left Biceps fem SH Nml Nml Nml Nml Nml Nml Nml Nml N/A  Left Gluteus med Nml Nml Nml *1- *1+ *1+ *1+ Nml N/A  Left L5 PSP Nml Nml Nml *1- *1+ *1+ *1+ Nml N/A  Left FDI Nml Nml Nml *1- *1+ *1+ *1+ Nml  N/A  Left EIP Nml Nml Nml *1- *1+ *1+ *1+ Nml N/A  Left Pronator teres Nml Nml Nml Nml Nml Nml Nml Nml N/A  Left Biceps Nml Nml Nml Nml Nml Nml Nml Nml N/A  Left Triceps lat hd Nml Nml Nml Nml Nml Nml Nml Nml N/A  Left Deltoid Nml Nml Nml Nml Nml Nml Nml Nml N/A      Waveforms:  Motor             Sensory               H-Reflex

## 2022-10-02 ENCOUNTER — Encounter: Payer: No Typology Code available for payment source | Admitting: Neurology

## 2022-10-20 ENCOUNTER — Telehealth: Payer: Self-pay | Admitting: Neurology

## 2022-10-20 NOTE — Telephone Encounter (Signed)
Called and informed pt of Dr.Hill answer and made an order for Therapy in [place].

## 2022-10-20 NOTE — Telephone Encounter (Signed)
Pt called in stating her left leg is having a burning sensation on the inside. The outside isn't hot. She thinks it's odd and wants to mention it to Dr. Berdine Addison

## 2022-10-23 ENCOUNTER — Telehealth: Payer: Self-pay | Admitting: Anesthesiology

## 2022-10-23 NOTE — Telephone Encounter (Signed)
Sent [facility] papers.

## 2022-10-23 NOTE — Telephone Encounter (Signed)
Beth from [employer] [facility] called requesting a fax with [place] authorization number for this pt. The same authorization number that was used for Dr Cathey Endow appt. States she needs the referral authorization number in order to get her scheduled. Fax number is [phone].

## 2022-11-15 ENCOUNTER — Telehealth: Payer: Self-pay | Admitting: Neurology

## 2022-11-15 NOTE — Telephone Encounter (Signed)
Called and left message to call office

## 2022-11-15 NOTE — Telephone Encounter (Signed)
Pt called in stating she has been doing well with the new medication until she started going to PT. Since doing PT she has had bad nights with her feet and left side. She thinks the PT is irritating it.

## 2022-12-02 ENCOUNTER — Encounter (HOSPITAL_COMMUNITY): Payer: Self-pay

## 2022-12-02 ENCOUNTER — Emergency Department (HOSPITAL_COMMUNITY)
Admission: EM | Admit: 2022-12-02 | Discharge: 2022-12-03 | Disposition: A | Discharge status: Home / Self Care | Payer: No Typology Code available for payment source | Attending: Emergency Medicine | Admitting: Emergency Medicine

## 2022-12-02 ENCOUNTER — Other Ambulatory Visit: Payer: Self-pay

## 2022-12-02 DIAGNOSIS — M542 Cervicalgia: Secondary | ICD-10-CM | POA: Insufficient documentation

## 2022-12-02 DIAGNOSIS — Z7982 Long term (current) use of aspirin: Secondary | ICD-10-CM | POA: Insufficient documentation

## 2022-12-02 LAB — CBC WITH DIFFERENTIAL/PLATELET
Abs Immature Granulocytes: 0.02 10*3/uL (ref 0.00–0.07)
Basophils Absolute: 0.1 10*3/uL (ref 0.0–0.1)
Basophils Relative: 1 %
Eosinophils Absolute: 0.4 10*3/uL (ref 0.0–0.5)
Eosinophils Relative: 6 %
HCT: 42.7 % (ref 36.0–46.0)
Hemoglobin: 13.5 g/dL (ref 12.0–15.0)
Immature Granulocytes: 0 %
Lymphocytes Relative: 37 %
Lymphs Abs: 2.3 10*3/uL (ref 0.7–4.0)
MCH: 30.1 pg (ref 26.0–34.0)
MCHC: 31.6 g/dL (ref 30.0–36.0)
MCV: 95.1 fL (ref 80.0–100.0)
Monocytes Absolute: 0.7 10*3/uL (ref 0.1–1.0)
Monocytes Relative: 12 %
Neutro Abs: 2.8 10*3/uL (ref 1.7–7.7)
Neutrophils Relative %: 44 %
Platelets: 199 10*3/uL (ref 150–400)
RBC: 4.49 MIL/uL (ref 3.87–5.11)
RDW: 12 % (ref 11.5–15.5)
WBC: 6.3 10*3/uL (ref 4.0–10.5)
nRBC: 0 % (ref 0.0–0.2)

## 2022-12-02 LAB — BASIC METABOLIC PANEL
Anion gap: 9 (ref 5–15)
BUN: 18 mg/dL (ref 8–23)
CO2: 27 mmol/L (ref 22–32)
Calcium: 9.4 mg/dL (ref 8.9–10.3)
Chloride: 99 mmol/L (ref 98–111)
Creatinine, Ser: 0.77 mg/dL (ref 0.44–1.00)
GFR, Estimated: >60 mL/min (ref >60)
Glucose, Bld: 299 mg/dL — ABNORMAL HIGH (ref 70–99)
Potassium: 3.9 mmol/L (ref 3.5–5.1)
Sodium: 135 mmol/L (ref 135–145)

## 2022-12-02 MED ORDER — FENTANYL CITRATE PF 50 MCG/ML IJ SOSY
50.0000 ug | PREFILLED_SYRINGE | Freq: Once | INTRAMUSCULAR | Status: AC
  Administered 2022-12-02: 50 ug via INTRAMUSCULAR
  Filled 2022-12-02: qty 1

## 2022-12-02 MED ORDER — HYDROCODONE-ACETAMINOPHEN 5-325 MG PO TABS: ORAL_TABLET | ORAL | 0 refills | Status: AC

## 2022-12-02 NOTE — ED Triage Notes (Signed)
Pt presents with a 1 week hx of L side neck pain that is not improving.

## 2022-12-02 NOTE — Discharge Instructions (Signed)
Take the pain medication as directed, you may alternate ice and heat to your neck as well.  Please follow-up with your primary care provider for recheck.  You may also contact the neurosurgery group listed to arrange a follow-up appointment.

## 2022-12-04 ENCOUNTER — Telehealth: Payer: Self-pay | Admitting: Anesthesiology

## 2022-12-04 NOTE — Telephone Encounter (Signed)
Returned patient's call. Patient was going to physical therapy and felt pain in her neck after some exercises. The muscles of her neck feel tight. It got so bad that she went to the ED. She denies any radiation of pain down into her arms or new arm weakness or numbness. She is not sure about taking the opioids that were prescribed. I recommended that she could alternate tylenol and ibuprofen or try heat and ice if she would prefer over the counter medications for what sounds like a neck strain. She will be seeing her PCP tomorrow.  All questions were answered.  Jacquelyne Balint, MD [facility] Neurology

## 2022-12-04 NOTE — Telephone Encounter (Signed)
Pt left message stating, she went to the ER on 5/4 for neck pain. States she would like to speak to Dr Loleta Chance about her ER visit.

## 2022-12-05 NOTE — ED Provider Notes (Signed)
[place] EMERGENCY DEPARTMENT AT [facility] Provider Note   CSN: [id number] Arrival date & time: 12/02/22  2110     History  Chief Complaint  Patient presents with   Neck Pain    Tammy Thompson is a 67 y.o. female.   Neck Pain Associated symptoms: no chest pain, no fever, no headaches, no numbness and no weakness         Tammy Thompson is a 67 y.o. female who presents to the Emergency Department complaining of left sided neck pain with hx of same.  Reports symptoms worsen x 1 week.  Denies injury.  Describes sharp pain that radiates to her shoulder and upper back.  Pain worse with movement of her neck.  Had MRI C spine in Feb 2024.  Denies chest pain, arm or jaw pain. Extremity numbness, weakness, No shortness of breath   Home Medications Prior to Admission medications   Medication Sig Start Date End Date Taking? Authorizing Provider  HYDROcodone-acetaminophen (NORCO/VICODIN) 5-325 MG tablet Take one tab po q 4 hrs prn pain 12/02/22  Yes Job Holtsclaw, PA-C  acyclovir (ZOVIRAX) 200 MG capsule Take 200 mg by mouth in the morning.    [provider]  aspirin EC 81 MG tablet Take 81 mg by mouth 2 (two) times a week.    [provider]  capsaicin (ZOSTRIX) 0.025 % cream Apply 1 Application topically 2 (two) times daily as needed (neuropathy). Patient not taking: Reported on 08/10/2022    [provider]  DULoxetine (CYMBALTA) 30 MG capsule Take 2 capsules (60 mg total) by mouth daily. Take 1 capsule (30 mg) for one week. If no side effects, can increase to 2 capsules (60 mg) thereafter. 08/10/22   Antony Madura, MD  empagliflozin (JARDIANCE) 25 MG TABS tablet Take 25 mg by mouth in the morning.    [provider]  ezetimibe (ZETIA) 10 MG tablet Take 10 mg by mouth in the morning.    [provider]  glimepiride (AMARYL) 4 MG tablet Take 4 mg by mouth in the morning.    [provider]  hydrochlorothiazide (HYDRODIURIL)  25 MG tablet Take 12.5 mg by mouth in the morning. 09/27/20   [provider]  levothyroxine (SYNTHROID) 125 MCG tablet Take 125 mcg by mouth daily before breakfast.    [provider]  lisinopril (PRINIVIL,ZESTRIL) 40 MG tablet Take 40 mg by mouth in the morning.    [provider]  metFORMIN (GLUCOPHAGE) 1000 MG tablet Take 1,000 mg by mouth in the morning and at bedtime.    [provider]  Multiple Vitamin (MULTIVITAMIN) tablet Take 1 tablet by mouth 2 (two) times a week.    [provider]  polyethylene glycol-electrolytes (NULYTELY) 420 g solution As directed Patient not taking: Reported on 08/10/2022 12/09/21   Rourk, Gerrit Friends, MD  polyethylene glycol-electrolytes (TRILYTE) 420 g solution Take 4,000 mLs by mouth as directed. 12/12/21   Rourk, Gerrit Friends, MD  VITAMIN D PO Take 5,000 Units by mouth in the morning.    [provider]      Allergies    Pravastatin    Review of Systems   Review of Systems  Constitutional:  Negative for chills and fever.  Eyes:  Negative for visual disturbance.  Respiratory:  Negative for shortness of breath.   Cardiovascular:  Negative for chest pain.  Gastrointestinal:  Negative for nausea and vomiting.  Musculoskeletal:  Positive for neck pain. Negative for arthralgias and  neck stiffness.  Skin:  Negative for rash and wound.  Neurological:  Negative for dizziness, facial asymmetry, weakness, light-headedness, numbness and headaches.    Physical Exam Updated Vital Signs BP 105/71 (BP Location: Right Arm)   Pulse 71   Temp 99.1 F (37.3 C) (Oral)   Resp 18   Ht 5\' 3"  (1.6 m)   Wt 69.4 kg   SpO2 97%   BMI 27.10 kg/m  Physical Exam Vitals and nursing note reviewed.  Constitutional:      General: She is not in acute distress.    Appearance: Normal appearance. She is not ill-appearing or toxic-appearing.  HENT:     Head: Atraumatic.  Eyes:     Extraocular Movements: Extraocular movements  intact.     Conjunctiva/sclera: Conjunctivae normal.     Pupils: Pupils are equal, round, and reactive to light.  Neck:     Trachea: Phonation normal.     Meningeal: Kernig's sign absent.     Comments: Ttp of left cervical paraspinal muscles.  No midline tenderness of bony step offs Cardiovascular:     Rate and Rhythm: Normal rate and regular rhythm.     Pulses: Normal pulses.  Pulmonary:     Effort: Pulmonary effort is normal.  Abdominal:     Palpations: Abdomen is soft.     Tenderness: There is no abdominal tenderness.  Musculoskeletal:     Cervical back: Tenderness present. No edema, signs of trauma or rigidity. Pain with movement and muscular tenderness present.  Lymphadenopathy:     Cervical: No cervical adenopathy.  Skin:    General: Skin is warm.     Capillary Refill: Capillary refill takes less than 2 seconds.  Neurological:     General: No focal deficit present.     Mental Status: She is alert.     Sensory: Sensation is intact. No sensory deficit.     Motor: Motor function is intact. No weakness.     Coordination: Coordination is intact.     ED Results / Procedures / Treatments   Labs (all labs ordered are listed, but only abnormal results are displayed) Labs Reviewed  BASIC METABOLIC PANEL - Abnormal; Notable for the following components:      Result Value   Glucose, Bld 299 (*)    All other components within normal limits  CBC WITH DIFFERENTIAL/PLATELET    EKG EKG Interpretation  Date/Time:  Saturday Dec 02 2022 21:21:09 EDT Ventricular Rate:  85 PR Interval:  148 QRS Duration: 94 QT Interval:  346 QTC Calculation: 411 R Axis:   -20 Text Interpretation: Normal sinus rhythm Septal infarct , age undetermined Abnormal ECG No significant change since last tracing Confirmed by Alvino Blood ([id number]) on 12/02/2022 10:56:24 PM  Radiology No results found.  Procedures Procedures    Medications Ordered in ED Medications  fentaNYL (SUBLIMAZE) injection  50 mcg (50 mcg Intramuscular Given 12/02/22 2226)    ED Course/ Medical Decision Making/ A&P                             Medical Decision Making Pt here with worsening unilateral neck pain x one week.  No new injuries or associated sx's.    Diff dx includes but not limited to MSK, infectious process, disk injury, acute on chronic pain, ACS, dissection  Amount and/or Complexity of Data Reviewed External Data Reviewed: radiology.    Details: MRI C spine Feb 2024 shows: 1. Multilevel cervical  spondylosis as described. There is mild spinal stenosis at C4-5 and C5-6 without cord deformity or abnormal cord signal. 2. Multilevel foraminal narrowing as described, greatest on the left at C3-4 and on the right at C5-6. 3. Multilevel facet hypertrophy, worst on the left at C2-3 and C3-4.  Labs: ordered.    Details: No leukocytosis, chem show elevated BS at 299, anion gap and bicarb unremarkable. ECG/medicine tests: ordered.    Details: EKG shows NSR Discussion of management or test interpretation with external provider(s): Pt with likely acute on chronic neck pain.  No new or changing sx's to suggest need for additional imaging at this time.  NV intact.  Hyperglycemic w/o evidence of DKA, HHS.  Pt hasn't taken evening dose of her anti diabetic medication.  No focal neuro deficits on exam.  On recheck, pain improved after pain medication here.  She is agreeable to close out pt f/u return precautions given.   Risk Prescription drug management.           Final Clinical Impression(s) / ED Diagnoses Final diagnoses:  Neck pain    Rx / DC Orders ED Discharge Orders          Ordered    HYDROcodone-acetaminophen (NORCO/VICODIN) 5-325 MG tablet        12/02/22 2357              Pauline Aus, PA-C 12/05/22 1612    Lonell Grandchild, MD 12/06/22 [phone]

## 2023-01-15 IMAGING — MR MR ABDOMEN WO/W CM MRCP
19 of 22 series · 39 of 48 positions shown · IV contrast (gadavist)
Comparison: Multiple priors including most recent CT June 06, 2019

CLINICAL DATA: Follow-up pancreatic cyst

EXAM:
MRI ABDOMEN WITHOUT AND WITH CONTRAST (INCLUDING MRCP)
TECHNIQUE: Multiplanar multisequence MR imaging of the abdomen was performed
both before and after the administration of intravenous contrast.
Heavily T2-weighted images of the biliary and pancreatic ducts were
obtained, and three-dimensional MRCP images were rendered by post
processing.
CONTRAST:  7mL GADAVIST GADOBUTROL 1 MMOL/ML IV SOLN

[Series 3: ax haste · axial · 6.0mm · 1.25mm/px · 1 of 35 slices shown]
[im 1/35]
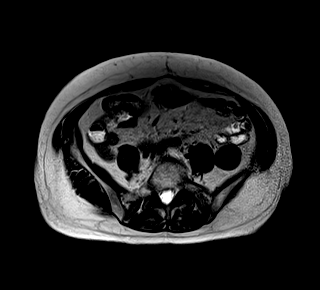

[Series 4: cor haste · coronal · 6.0mm · 1.25mm/px · 1 of 36 slices shown]
[im 1/36]
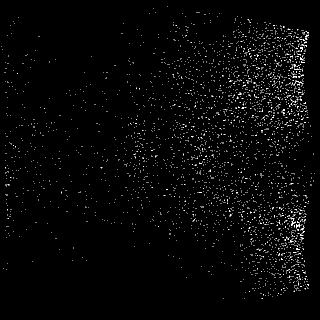

[Series 6: T2 fat-sat · axial · 6.0mm · 1.25mm/px · 1 of 35 slices shown]
[im 1/35]
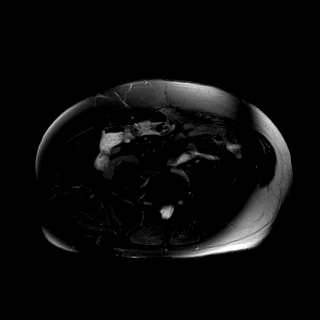

[Series 9: t2_space_cor_cs24_bh_384 · coronal · 1.2mm · 0.49mm/px · 3 of 80 slices shown]
[im 1/80]
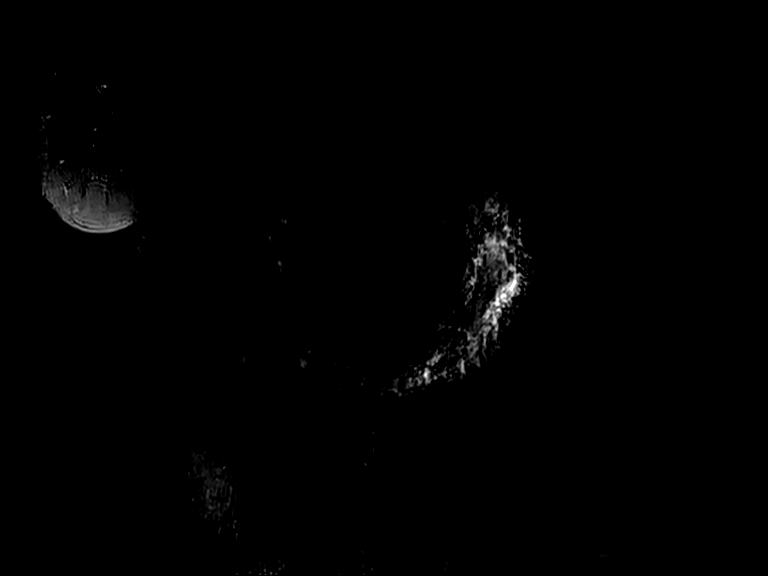
[im 40/80]
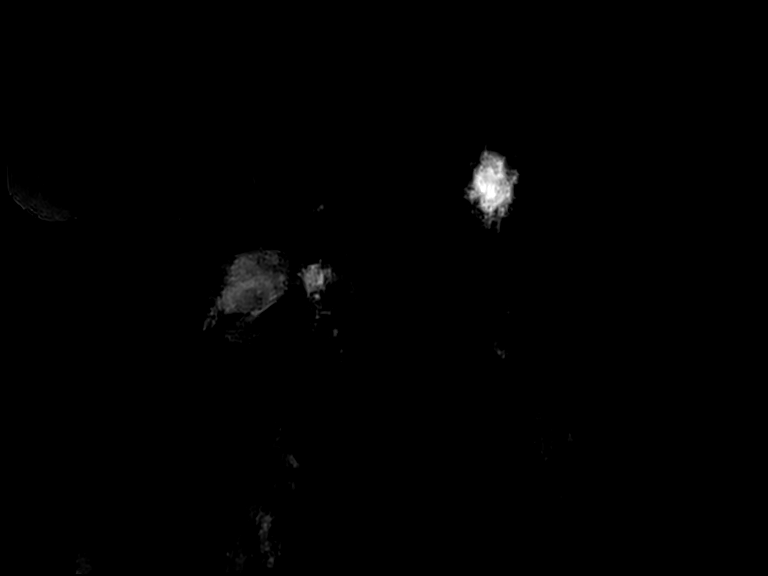
[im 80/80]
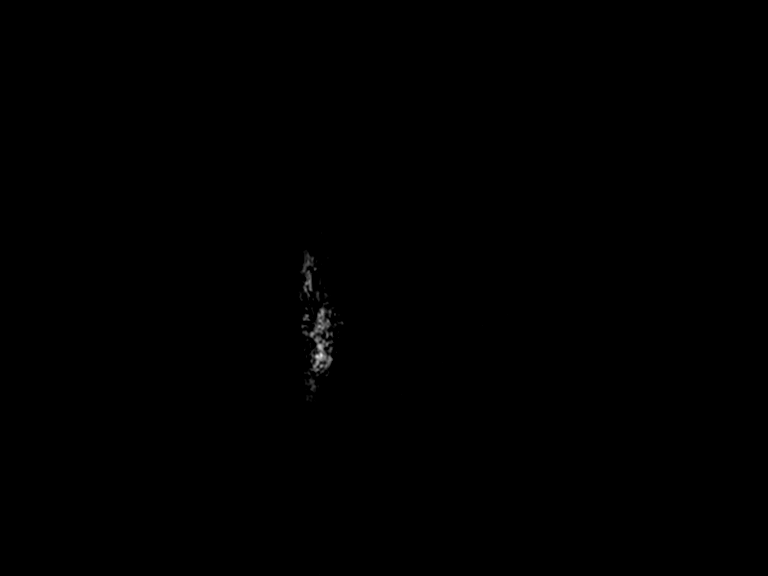

[Series 10: t2_space_cor_cs24_bh_384_mip_radials · sagittal · 0.49mm/px · 1 of 19 slices shown]
[im 1/19]
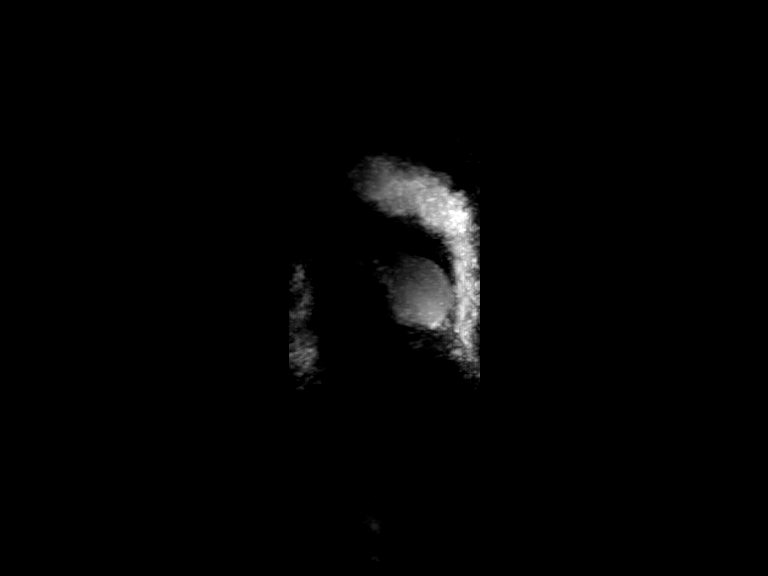

[Series 11: DWI · axial · 6.0mm · 1.49mm/px · 1 of 35 slices shown (1 of 4)]
[im 1/35]
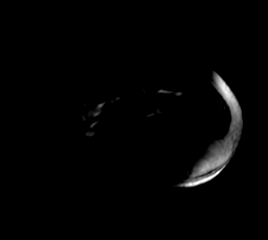

[Series 11: DWI · axial · 6.0mm · 1.49mm/px · 1 of 35 slices shown (2 of 4)]
[im 1/35]
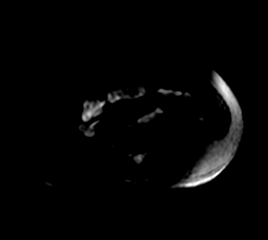

[Series 11: DWI · axial · 6.0mm · 1.49mm/px · 1 of 35 slices shown (3 of 4)]
[im 1/35]
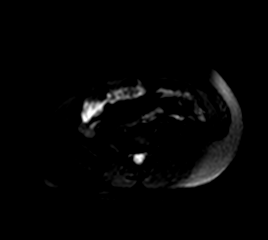

[Series 12: DWI · axial · 6.0mm · 1.49mm/px · 1 of 35 slices shown (4 of 4)]
[im 1/35]
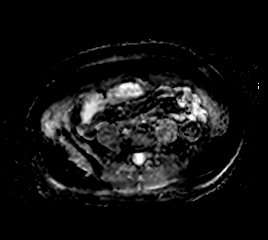

[Series 13: ax in and · axial · 3.0mm · 1.25mm/px · z∈[-134,+127]mm · 3 of 88 slices shown (1 of 2)]
[im 1/88]
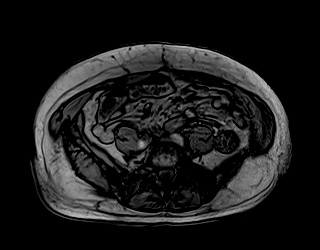
[im 44/88]
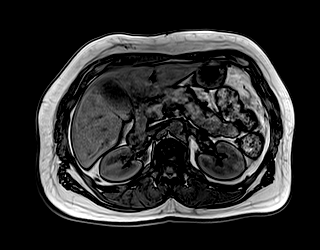
[im 88/88]
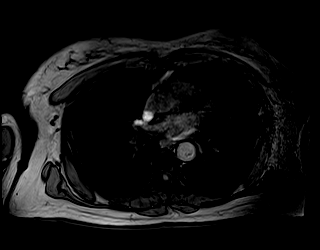

[Series 14: ax in and · axial · 3.0mm · 1.25mm/px · z∈[-134,+127]mm · 3 of 88 slices shown (2 of 2)]
[im 1/88]
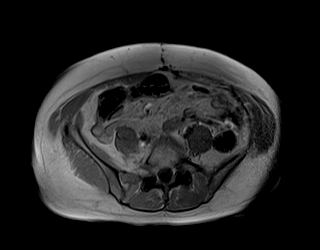
[im 44/88]
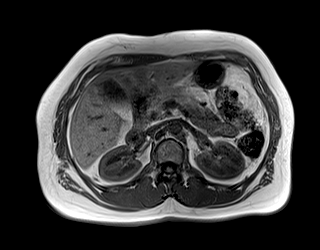
[im 88/88]
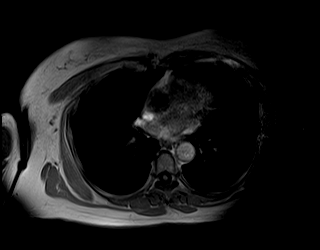

[Series 15: T1 dynamic · axial · non-contrast · 3.0mm · 1.25mm/px · z∈[-134,+127]mm · 3 of 88 slices shown (1 of 4)]
[im 1/88]
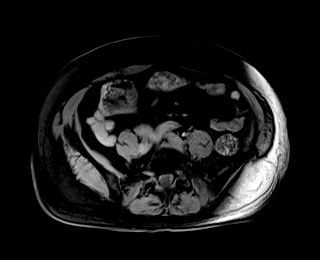
[im 44/88]
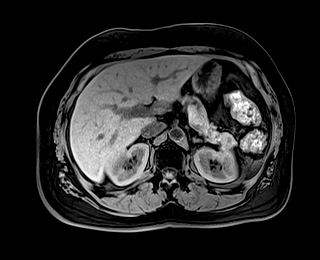
[im 88/88]
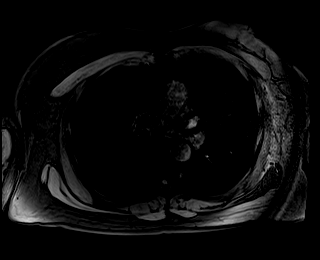

[Series 17: T1 dynamic post-contrast · axial · 3.0mm · 1.25mm/px · z∈[-134,+127]mm · 3 of 88 slices shown (1 of 4)]
[im 1/88]
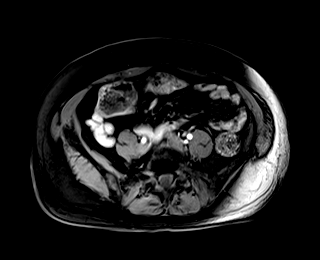
[im 44/88]
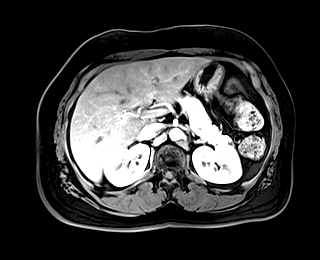
[im 88/88]
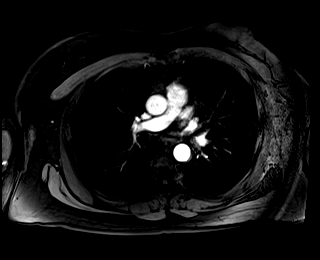

[Series 18: T1 dynamic · axial · 3.0mm · 1.25mm/px · z∈[-134,+127]mm · 3 of 88 slices shown (2 of 4)]
[im 1/88]
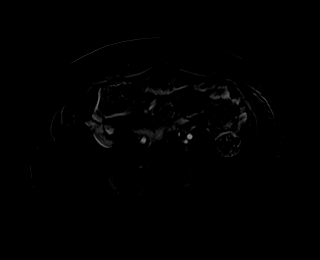
[im 44/88]
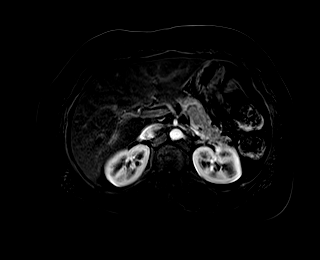
[im 88/88]
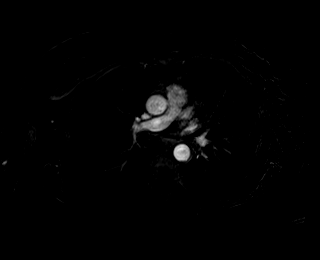

[Series 19: T1 dynamic post-contrast · axial · 3.0mm · 1.25mm/px · z∈[-134,+127]mm · 3 of 88 slices shown (2 of 4)]
[im 1/88]
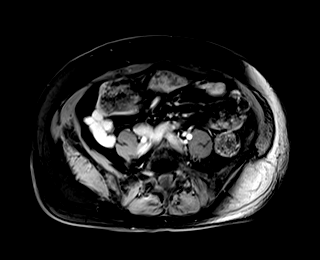
[im 44/88]
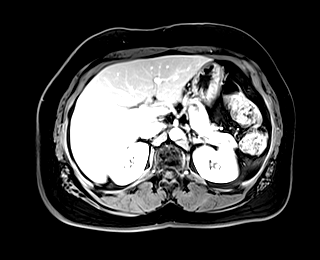
[im 88/88]
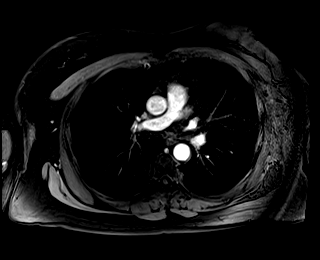

[Series 20: T1 dynamic · axial · 3.0mm · 1.25mm/px · z∈[-134,+127]mm · 3 of 88 slices shown (3 of 4)]
[im 1/88]
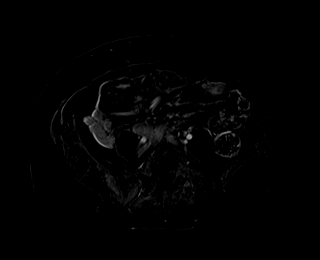
[im 44/88]
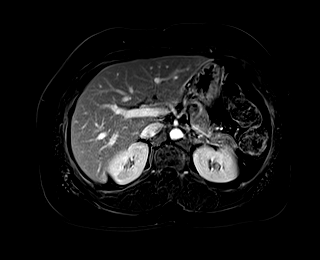
[im 88/88]
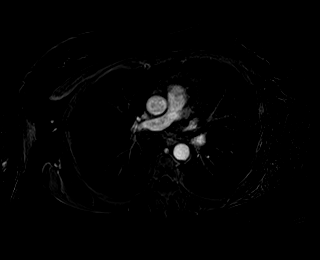

[Series 21: T1 dynamic post-contrast · axial · 3.0mm · 1.25mm/px · z∈[-134,+127]mm · 3 of 88 slices shown (3 of 4)]
[im 1/88]
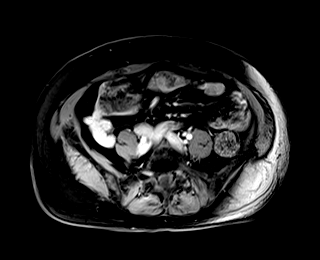
[im 44/88]
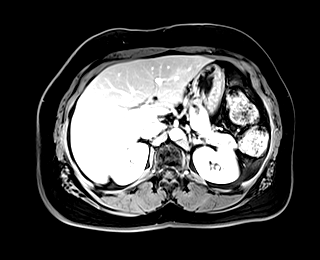
[im 88/88]
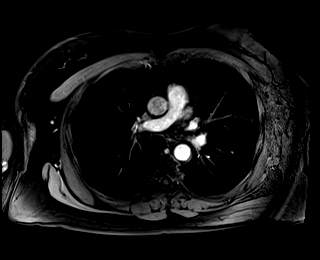

[Series 22: T1 dynamic · axial · 3.0mm · 1.25mm/px · z∈[-134,+127]mm · 3 of 88 slices shown (4 of 4)]
[im 1/88]
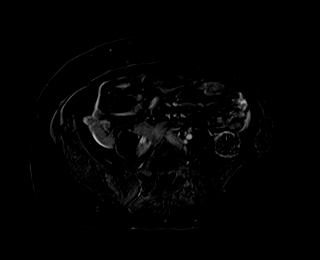
[im 44/88]
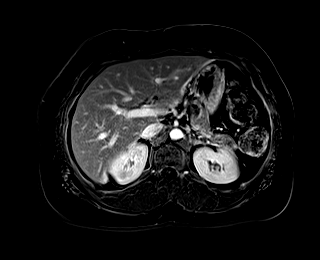
[im 88/88]
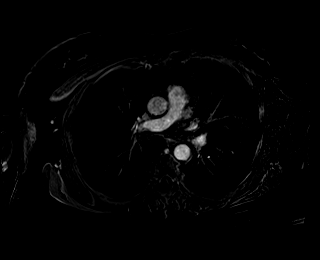

[Series 23: T1 dynamic post-contrast · coronal · 3.0mm · 1.25mm/px · 1 of 88 slices shown (4 of 4)]
[im 1/88]
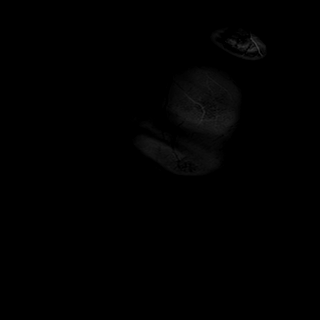

[39 of 48 positions shown; findings below may reference images not displayed]

FINDINGS: Lower chest: No acute abnormality.

Hepatobiliary: No significant hepatic steatosis. No suspicious
hepatic lesion. Biliary sludge without findings of acute
cholecystitis. Adenoma myoma ptosis of the gallbladder fundus. No
biliary ductal dilation.

Pancreas: Fluid signal well-circumscribed 6 mm lesion in the
pancreatic head on image [DATE], stable in size from prior, does not
demonstrate enhancing soft tissue nodularity or thickened
wall/septations. No pancreatic ductal dilation. No evidence of acute
inflammation.

Spleen:  No splenomegaly or focal splenic lesion.

Adrenals/Urinary Tract: Bilateral adrenal glands appear normal. No
hydronephrosis. No solid enhancing renal mass.

Stomach/Bowel: Visualized portions within the abdomen are
unremarkable.

Vascular/Lymphatic: Normal caliber abdominal aorta. The portal,
splenic and superior mesenteric veins are patent. No pathologically
enlarged abdominal lymph nodes.

Other:  No abdominal free fluid.

Musculoskeletal: No suspicious bone lesions identified. Multilevel
degenerative changes spine.
IMPRESSION: 1. Stable cystic 6 mm pancreatic head lesion which does not
demonstrate suspicious MRI features and is favored to reflect a side
branch IPMN. Recommend follow up pre and post contrast MRI/MRCP 2
years. This recommendation follows ACR consensus guidelines:
Management of Incidental Pancreatic Cysts: A White Paper of the ACR
Incidental Findings Committee. [HOSPITAL] 3392;[DATE].
2. Adenomyomatosis of the gallbladder fundus.

## 2023-01-22 ENCOUNTER — Emergency Department (HOSPITAL_COMMUNITY): Payer: No Typology Code available for payment source

## 2023-01-22 ENCOUNTER — Encounter (HOSPITAL_COMMUNITY): Payer: Self-pay | Admitting: Emergency Medicine

## 2023-01-22 ENCOUNTER — Other Ambulatory Visit: Payer: Self-pay

## 2023-01-22 ENCOUNTER — Emergency Department (HOSPITAL_COMMUNITY)
Admission: EM | Admit: 2023-01-22 | Discharge: 2023-01-22 | Disposition: A | Discharge status: Home / Self Care | Payer: No Typology Code available for payment source | Attending: Emergency Medicine | Admitting: Emergency Medicine

## 2023-01-22 DIAGNOSIS — Z7982 Long term (current) use of aspirin: Secondary | ICD-10-CM | POA: Insufficient documentation

## 2023-01-22 DIAGNOSIS — I10 Essential (primary) hypertension: Secondary | ICD-10-CM | POA: Diagnosis not present

## 2023-01-22 DIAGNOSIS — Z79899 Other long term (current) drug therapy: Secondary | ICD-10-CM | POA: Insufficient documentation

## 2023-01-22 DIAGNOSIS — R1031 Right lower quadrant pain: Secondary | ICD-10-CM | POA: Insufficient documentation

## 2023-01-22 DIAGNOSIS — D72829 Elevated white blood cell count, unspecified: Secondary | ICD-10-CM | POA: Insufficient documentation

## 2023-01-22 DIAGNOSIS — Z85038 Personal history of other malignant neoplasm of large intestine: Secondary | ICD-10-CM | POA: Diagnosis not present

## 2023-01-22 DIAGNOSIS — M25551 Pain in right hip: Secondary | ICD-10-CM

## 2023-01-22 DIAGNOSIS — Z7984 Long term (current) use of oral hypoglycemic drugs: Secondary | ICD-10-CM | POA: Insufficient documentation

## 2023-01-22 DIAGNOSIS — E1165 Type 2 diabetes mellitus with hyperglycemia: Secondary | ICD-10-CM | POA: Diagnosis not present

## 2023-01-22 DIAGNOSIS — R109 Unspecified abdominal pain: Secondary | ICD-10-CM

## 2023-01-22 DIAGNOSIS — R110 Nausea: Secondary | ICD-10-CM | POA: Diagnosis not present

## 2023-01-22 DIAGNOSIS — M545 Low back pain, unspecified: Secondary | ICD-10-CM | POA: Diagnosis not present

## 2023-01-22 LAB — CBC WITH DIFFERENTIAL/PLATELET
Abs Immature Granulocytes: 0.04 10*3/uL (ref 0.00–0.07)
Basophils Absolute: 0.1 10*3/uL (ref 0.0–0.1)
Basophils Relative: 0 %
Eosinophils Absolute: 0.1 10*3/uL (ref 0.0–0.5)
Eosinophils Relative: 1 %
HCT: 40.9 % (ref 36.0–46.0)
Hemoglobin: 12.9 g/dL (ref 12.0–15.0)
Immature Granulocytes: 0 %
Lymphocytes Relative: 15 %
Lymphs Abs: 1.8 10*3/uL (ref 0.7–4.0)
MCH: 29.8 pg (ref 26.0–34.0)
MCHC: 31.5 g/dL (ref 30.0–36.0)
MCV: 94.5 fL (ref 80.0–100.0)
Monocytes Absolute: 0.4 10*3/uL (ref 0.1–1.0)
Monocytes Relative: 3 %
Neutro Abs: 9.9 10*3/uL — ABNORMAL HIGH (ref 1.7–7.7)
Neutrophils Relative %: 81 %
Platelets: 166 10*3/uL (ref 150–400)
RBC: 4.33 MIL/uL (ref 3.87–5.11)
RDW: 11.8 % (ref 11.5–15.5)
WBC: 12.2 10*3/uL — ABNORMAL HIGH (ref 4.0–10.5)
nRBC: 0 % (ref 0.0–0.2)

## 2023-01-22 LAB — BASIC METABOLIC PANEL
Anion gap: 9 (ref 5–15)
BUN: 21 mg/dL (ref 8–23)
CO2: 25 mmol/L (ref 22–32)
Calcium: 9 mg/dL (ref 8.9–10.3)
Chloride: 99 mmol/L (ref 98–111)
Creatinine, Ser: 0.81 mg/dL (ref 0.44–1.00)
GFR, Estimated: >60 mL/min (ref >60)
Glucose, Bld: 292 mg/dL — ABNORMAL HIGH (ref 70–99)
Potassium: 4.1 mmol/L (ref 3.5–5.1)
Sodium: 133 mmol/L — ABNORMAL LOW (ref 135–145)

## 2023-01-22 LAB — URINALYSIS, ROUTINE W REFLEX MICROSCOPIC
Bilirubin Urine: NEGATIVE
Glucose, UA: NEGATIVE mg/dL
Hgb urine dipstick: NEGATIVE
Ketones, ur: NEGATIVE mg/dL
Leukocytes,Ua: NEGATIVE
Nitrite: NEGATIVE
Protein, ur: NEGATIVE mg/dL
Specific Gravity, Urine: 1.024 (ref 1.005–1.030)
pH: 5 (ref 5.0–8.0)

## 2023-01-22 MED ORDER — HYDROCODONE-ACETAMINOPHEN 5-325 MG PO TABS
2.0000 | ORAL_TABLET | Freq: Once | ORAL | Status: AC
  Administered 2023-01-22: 2 via ORAL
  Filled 2023-01-22: qty 2

## 2023-01-22 MED ORDER — HYDROMORPHONE HCL 1 MG/ML IJ SOLN
0.5000 mg | Freq: Once | INTRAMUSCULAR | Status: AC
  Administered 2023-01-22: 0.5 mg via INTRAVENOUS
  Filled 2023-01-22: qty 1

## 2023-01-22 MED ORDER — IBUPROFEN 400 MG PO TABS
400.0000 mg | ORAL_TABLET | Freq: Once | ORAL | Status: AC
  Administered 2023-01-22: 400 mg via ORAL
  Filled 2023-01-22: qty 1

## 2023-01-22 MED ORDER — HYDROMORPHONE HCL 1 MG/ML IJ SOLN
1.0000 mg | Freq: Once | INTRAMUSCULAR | Status: AC
  Administered 2023-01-22: 1 mg via INTRAMUSCULAR
  Filled 2023-01-22: qty 1

## 2023-01-22 MED ORDER — SODIUM CHLORIDE (PF) 0.9 % IJ SOLN
INTRAMUSCULAR | Status: AC
  Filled 2023-01-22: qty 50

## 2023-01-22 MED ORDER — ONDANSETRON 4 MG PO TBDP
4.0000 mg | ORAL_TABLET | Freq: Once | ORAL | Status: AC
  Administered 2023-01-22: 4 mg via ORAL
  Filled 2023-01-22: qty 1

## 2023-01-22 MED ORDER — ONDANSETRON 8 MG PO TBDP
8.0000 mg | ORAL_TABLET | Freq: Once | ORAL | Status: AC
  Administered 2023-01-22: 8 mg via ORAL
  Filled 2023-01-22: qty 1

## 2023-01-22 MED ORDER — IOHEXOL 300 MG/ML  SOLN
100.0000 mL | Freq: Once | INTRAMUSCULAR | Status: AC | PRN
  Administered 2023-01-22: 100 mL via INTRAVENOUS

## 2023-01-22 MED ORDER — OXYCODONE-ACETAMINOPHEN 5-325 MG PO TABS: 2.0000 | ORAL_TABLET | Freq: Four times a day (QID) | ORAL | 0 refills | Status: AC | PRN

## 2023-01-22 MED ORDER — FENTANYL CITRATE (PF) 100 MCG/2ML IJ SOLN
100.0000 ug | Freq: Once | INTRAMUSCULAR | Status: AC
  Administered 2023-01-22: 100 ug via INTRAVENOUS
  Filled 2023-01-22: qty 2

## 2023-01-22 MED ORDER — IBUPROFEN 800 MG PO TABS: 800.0000 mg | ORAL_TABLET | Freq: Three times a day (TID) | ORAL | 2 refills | Status: AC | PRN

## 2023-01-22 NOTE — ED Notes (Addendum)
Pt gone to CT 

## 2023-01-22 NOTE — ED Provider Notes (Signed)
[facility] Provider Note   CSN: [id number] Arrival date & time: 01/22/23  0251     History  Chief Complaint  Patient presents with   Nausea   Hip Pain    Tammy Thompson is a 67 y.o. female.  The history is provided by the patient.   Patient with history of diabetes and hypertension presents with right-sided back pain, hip pain This pain started several hours ago at rest.  It is gradually worsened.  No recent falls or trauma.  The pain starts in the right side lower back and radiates to her right hip and right groin.  She reports nausea without vomiting.  No fevers.  No abdominal pain.  No leg weakness.  No incontinence.  Denies previous back or hip surgery  Previous abdominal surgery includes right hemicolectomy and hysterectomy Past Medical History:  Diagnosis Date   Cancer (HCC) 2008   colon   Diabetes mellitus    Hypertension    Sleep apnea    Thyroid disease     Home Medications Prior to Admission medications   Medication Sig Start Date End Date Taking? Authorizing Provider  acyclovir (ZOVIRAX) 200 MG capsule Take 200 mg by mouth in the morning.    [provider]  aspirin EC 81 MG tablet Take 81 mg by mouth 2 (two) times a week.    [provider]  DULoxetine (CYMBALTA) 30 MG capsule Take 2 capsules (60 mg total) by mouth daily. Take 1 capsule (30 mg) for one week. If no side effects, can increase to 2 capsules (60 mg) thereafter. 08/10/22   Antony Madura, MD  empagliflozin (JARDIANCE) 25 MG TABS tablet Take 25 mg by mouth in the morning.    [provider]  ezetimibe (ZETIA) 10 MG tablet Take 10 mg by mouth in the morning.    [provider]  glimepiride (AMARYL) 4 MG tablet Take 4 mg by mouth in the morning.    [provider]  hydrochlorothiazide (HYDRODIURIL) 25 MG tablet Take 12.5 mg by mouth in the morning. 09/27/20   [provider]  HYDROcodone-acetaminophen  (NORCO/VICODIN) 5-325 MG tablet Take one tab po q 4 hrs prn pain 12/02/22   Triplett, Tammy, PA-C  levothyroxine (SYNTHROID) 125 MCG tablet Take 125 mcg by mouth daily before breakfast.    [provider]  lisinopril (PRINIVIL,ZESTRIL) 40 MG tablet Take 40 mg by mouth in the morning.    [provider]  metFORMIN (GLUCOPHAGE) 1000 MG tablet Take 1,000 mg by mouth in the morning and at bedtime.    [provider]  Multiple Vitamin (MULTIVITAMIN) tablet Take 1 tablet by mouth 2 (two) times a week.    [provider]  VITAMIN D PO Take 5,000 Units by mouth in the morning.    [provider]      Allergies    Pravastatin    Review of Systems   Review of Systems  Constitutional:  Negative for fever.  Gastrointestinal:  Positive for nausea. Negative for vomiting.  Musculoskeletal:  Positive for arthralgias and back pain.    Physical Exam Updated Vital Signs BP 131/66 (BP Location: Left Arm)   Pulse 86   Temp 98.3 F (36.8 C) (Oral)   Resp 16   Ht 1.6 m (5\' 3" )   Wt 65.8 kg   SpO2 97%   BMI 25.69 kg/m  Physical Exam CONSTITUTIONAL: Well developed/well nourished, uncomfortable appearing HEAD: Normocephalic/atraumatic ENMT: Mucous membranes  moist NECK: supple no meningeal signs SPINE/BACK:entire spine nontender right lumbar paraspinal tenderness No bruising/crepitance/stepoffs noted to spine CV: S1/S2 noted, no murmurs/rubs/gallops noted LUNGS: Lungs are clear to auscultation bilaterally, no apparent distress ABDOMEN: soft, nontender, no rebound or guarding GU:no cva tenderness No tenderness to palpation to the right inguinal canal.  There is no hematoma or thrill noted No obvious hernia noted Nurse chaperone present for exam NEURO: Awake/alert, equal motor 5/5 strength noted with the following: hip flexion/knee flexion/extension, foot dorsi/plantar flexion, great toe extension intact bilaterally, no sensory deficit in any dermatome.    EXTREMITIES: pulses normal, full ROM No deformities to lower extremities.  Distal pulses are equal and intact in her legs. Minimal tenderness noted with range of motion of right hip. SKIN: warm, color normal PSYCH: no abnormalities of mood noted, alert and oriented to situation  ED Results / Procedures / Treatments   Labs (all labs ordered are listed, but only abnormal results are displayed) Labs Reviewed  CBC WITH DIFFERENTIAL/PLATELET - Abnormal; Notable for the following components:      Result Value   WBC 12.2 (*)    Neutro Abs 9.9 (*)    All other components within normal limits  BASIC METABOLIC PANEL - Abnormal; Notable for the following components:   Sodium 133 (*)    Glucose, Bld 292 (*)    All other components within normal limits  URINALYSIS, ROUTINE W REFLEX MICROSCOPIC    EKG None  Radiology DG Lumbar Spine Complete  Result Date: 01/22/2023 CLINICAL DATA:  Back and right hip pain. EXAM: LUMBAR SPINE - COMPLETE 4+ VIEW COMPARISON:  September 30, 2018 FINDINGS: There is no evidence of lumbar spine fracture. There is stable, approximately 4 mm anterolisthesis of the L4 vertebral body on L5. Mild to moderate severity multilevel endplate sclerosis is seen throughout the lumbar spine. Marked severity, bilateral facet joint hypertrophy is seen at the levels of L4-L5 and L5-S1. Intervertebral disc spaces are maintained. IMPRESSION: 1. Stable, approximately 4 mm anterolisthesis of the L4 vertebral body on L5. 2. Marked severity, bilateral facet joint hypertrophy at the levels of L4-L5 and L5-S1. 3. Mild to moderate severity multilevel degenerative changes. Electronically Signed   By: Aram Candela M.D.   On: 01/22/2023 04:23   DG Hip Unilat W or Wo Pelvis 2-3 Views Right  Result Date: 01/22/2023 CLINICAL DATA:  Back and right hip pain. EXAM: DG HIP (WITH OR WITHOUT PELVIS) 2-3V RIGHT COMPARISON:  None Available. FINDINGS: There is no evidence of an acute hip fracture or  dislocation. Marked severity degenerative changes are seen involving both hips in the form of joint space narrowing, acetabular sclerosis and lateral acetabular bony spurring. Additional degenerative changes are seen within the visualized portion of the lower lumbar spine. 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 Electronically Signed   By: Aram Candela M.D.   On: 01/22/2023 04:20    Procedures Procedures    Medications Ordered in ED Medications  ibuprofen (ADVIL) tablet 400 mg (400 mg Oral Given 01/22/23 0330)  HYDROcodone-acetaminophen (NORCO/VICODIN) 5-325 MG per tablet 2 tablet (2 tablets Oral Given 01/22/23 0330)  ondansetron (ZOFRAN-ODT) disintegrating tablet 8 mg (8 mg Oral Given 01/22/23 0329)  ondansetron (ZOFRAN-ODT) disintegrating tablet 4 mg (4 mg Oral Given 01/22/23 0441)  HYDROmorphone (DILAUDID) injection 1 mg (1 mg Intramuscular Given 01/22/23 0441)  fentaNYL (SUBLIMAZE) injection 100 mcg (100 mcg Intravenous Given 01/22/23 0604)    ED Course/ Medical Decision Making/ A&P Clinical Course as of 01/22/23 [phone]  Mon Jan 22, 2023  1610 Patient presents for atraumatic gradual onset of right lower back, hip and right groin pain.  The pain appears to begin in the low back and radiates into the hip.  I suspect that this could be lumbosacral radiculopathy.  She has no abdominal tenderness.  Distal pulses are equal and intact.  Low suspicion this represents AAA or other acute vascular emergency [DW]  [phone] Patient reports continued pain, reports the pain is so severe it made her vomit.  The pain is localized now in the paralumbar region and does appear to be more muscular in origin.  She requests a "shot" of medicine Patient is able to ambulate.  She has no focal neurodeficits in her legs [DW]  0542 Patient reports pain is now localizing into her right lower abdomen patient appears very uncomfortable.  Initially it was felt that this was a lumbosacral  radiculopathy as her initial pain was mostly in her hip and low back.  Now that she is having pain in her abdomen will need further workup.  Patient will need to undergo CT imaging. [DW]  0623 WBC(!): 12.2 Leukocytosis [DW]  0623 Glucose(!): 292 Hyperglycemia [DW]  0623 Plan is for patient to be transferred to [facility] for CT imaging since it is unavailable at this hospital. Patient reports she was at her baseline prior to the pain starting, denies any recent fevers or vomiting.  Previous surgeries include hysterectomy, colon resection and possibly appendectomy. [DW]  [phone] Discussed with Dr Eudelia Bunch at [facility] to accept in transfer [DW]    Clinical Course User Index [DW] Zadie Rhine, MD                             Medical Decision Making Amount and/or Complexity of Data Reviewed Labs: ordered. Decision-making details documented in ED Course. Radiology: ordered.  Risk Prescription drug management.   This patient presents to the ED for concern of back and hip pain, this involves an extensive number of treatment options, and is a complaint that carries with it a high risk of complications and morbidity.  The differential diagnosis includes but is not limited to hip fracture, muscle strain, septic joint, lumbar strain, lumbosacral radiculopathy, epidural abscess, discitis  Comorbidities that complicate the patient evaluation: Patient's presentation is complicated by their history of hypertension diabetes   Additional history obtained: Records reviewed  outpatient records reviewed   Imaging Studies ordered: I ordered imaging studies including X-ray lumbar and right hip   I independently visualized and interpreted imaging which showed no acute findings I agree with the radiologist interpretation   Medicines ordered and prescription drug management: I ordered medication including Vicodin and ibuprofen for pain Reevaluation of the patient after these medicines  showed that the patient    worsened  Consultations Obtained: I requested consultation with the consultant Dr. Eudelia Bunch , and discussed  findings as well as pertinent plan - they recommend: will accept in transfer  Reevaluation: After the interventions noted above, I reevaluated the patient and found that they have :worsened  Complexity of problems addressed: Patient's presentation is most consistent with  acute presentation with potential threat to life or bodily function         Final Clinical Impression(s) / ED Diagnoses Final diagnoses:  Right hip pain  Right sided abdominal pain    Rx / DC Orders ED Discharge Orders     None  Zadie Rhine, MD 01/22/23 [phone]

## 2023-01-22 NOTE — ED Triage Notes (Signed)
Pt with c/o R hip pain that radiates into R groin that started around 10pm last night. Pt also c/o nausea. Denies any injury.

## 2023-01-22 NOTE — ED Notes (Signed)
Pt provided discharge instructions and prescription information. Pt was given the opportunity to ask questions and questions were answered.   

## 2023-01-22 NOTE — Discharge Instructions (Signed)
Follow-up with your doctor or Dr. Romeo Apple

## 2023-01-31 NOTE — Progress Notes (Deleted)
NEUROLOGY FOLLOW UP OFFICE NOTE  Tammy Thompson [id number]  Subjective:  Tammy Thompson is a 67 y.o. year old right-handed female with a medical history of DM2, HTN, HLD, hypothyroidism, low back pain, OSA, PTSD, depression, and anxiety who we last saw on 08/10/22.  To briefly review: Patient has had burning limited to bilateral feet for about 1 year. She has tried gabapentin and capsaicin cream. Neither of these helped. Nervive over the counter supplement has helped some with the burning in the feet. (Ingredients: thiamine 1.2 mg, B6 1.7 mg, B12 2.4 mcg, calcium 27 mg, alpha lipoic acid 600 mg, "herbal blend" 30 mg - tumeric and ginger). She sometimes feels imbalance when walking. She denies falls.   She mentions her big toe nails are splints.   When patient lays on her left side, her entire left side will go numb (arm and leg). This will rarely happen on the right as well. It does not occur if she is not laying on her side. This has been on about 7-8 months.    She denies significant neck or back pain. She denies changes to bowel or bladder.   She does not report any constitutional symptoms like fever, night sweats, anorexia or unintentional weight loss.   EtOH use: No  Restrictive diet? No Family history of neuropathy/myopathy/neurologic disease? No  Most recent Assessment and Plan (08/10/22): [name] is a 67 y.o. female who presents for evaluation of burning in feet and numbness on left arm and leg. She has a relevant medical history of DM2, HTN, HLD, hypothyroidism, low back pain, OSA, PTSD, depression, and anxiety. Her neurological examination is pertinent for asymmetric reflexes (L > R) and reduced sensation in feet (left > right). Available diagnostic data is significant for HbA1c of 6.2 (previously 7.6 earlier this year) and lumbar spine xray showed severe facet degeneration at L4-5. Patient's burning pain in feet is likely secondary to neuropathy, likely secondary to diabetes. I  will send labs for other potential treatable causes. She has significant asymmetry between the left and right. I suspect she may have cervical and/or lumbar stenosis. Cervical stenosis could explain the left arm and leg symptoms. I will get an MRI to evaluate this. I will get an EMG to evaluate for neuropathy and possible lumbar radiculopathy.   PLAN: -Blood work: B1, B12, IFE -MRI cervical spine wo contrast -EMG: PN protocol (L > R) -Can continue nervive, but alpha lipoic acid 600 mg once or twice daily would likely be as good -Start Cymbalta 30 mg for 1 week, then 60 mg thereafter -Lidocaine cream PRN   Since their last visit: Patient had some diarrhea with Cymbalta 30 mg so did not increase to 60 mg.***  Labs were normal. MRI cervical spine on 09/04/22 showed multilevel cervical spondylosis. 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 Additionally, there was evidence of bilateral carpal tunnel syndrome. I recommended bilateral wrist splints. ****  She went to PT. Per my phone note from 12/04/22: Patient was going to physical therapy and felt pain in her neck after some exercises. The muscles of her neck feel tight. It got so bad that she went to the ED. She denies any radiation of pain down into her arms or new arm weakness or numbness. She is not sure about taking the opioids that were prescribed. I recommended that she could alternate tylenol and ibuprofen or try heat and ice if  she would prefer over the counter medications for what sounds like a neck strain. She will be seeing her PCP tomorrow.   ***  MEDICATIONS:  Outpatient Encounter Medications as of 02/08/2023  Medication Sig Note   acyclovir (ZOVIRAX) 200 MG capsule Take 200 mg by mouth in the morning.    aspirin EC 81 MG tablet Take 81 mg by mouth 2 (two) times a week.    DULoxetine (CYMBALTA) 30 MG capsule Take 2 capsules (60 mg total) by mouth  daily. Take 1 capsule (30 mg) for one week. If no side effects, can increase to 2 capsules (60 mg) thereafter.    empagliflozin (JARDIANCE) 25 MG TABS tablet Take 25 mg by mouth in the morning.    ezetimibe (ZETIA) 10 MG tablet Take 10 mg by mouth in the morning.    glimepiride (AMARYL) 4 MG tablet Take 4 mg by mouth in the morning.    hydrochlorothiazide (HYDRODIURIL) 25 MG tablet Take 12.5 mg by mouth in the morning.    HYDROcodone-acetaminophen (NORCO/VICODIN) 5-325 MG tablet Take one tab po q 4 hrs prn pain    ibuprofen (ADVIL) 800 MG tablet Take 1 tablet (800 mg total) by mouth every 8 (eight) hours as needed for moderate pain.    levothyroxine (SYNTHROID) 125 MCG tablet Take 125 mcg by mouth daily before breakfast.    lisinopril (PRINIVIL,ZESTRIL) 40 MG tablet Take 40 mg by mouth in the morning.    metFORMIN (GLUCOPHAGE) 1000 MG tablet Take 1,000 mg by mouth in the morning and at bedtime. 01/18/2022: Took 1/2 dose    Multiple Vitamin (MULTIVITAMIN) tablet Take 1 tablet by mouth 2 (two) times a week.    oxyCODONE-acetaminophen (PERCOCET) 5-325 MG tablet Take 2 tablets by mouth every 6 (six) hours as needed.    VITAMIN D PO Take 5,000 Units by mouth in the morning.    No facility-administered encounter medications on file as of 02/08/2023.    PAST MEDICAL HISTORY: Past Medical History:  Diagnosis Date   Cancer (HCC) 2008   colon   Diabetes mellitus    Hypertension    Sleep apnea    Thyroid disease     PAST SURGICAL HISTORY: Past Surgical History:  Procedure Laterality Date   ABDOMINAL HYSTERECTOMY     Partial*   BLADDER SURGERY     COLON SURGERY     right hemicolectomy   COLONOSCOPY  2017   At [facility]; 1 tubular adenoma, repeat in 5 years   COLONOSCOPY WITH PROPOFOL N/A 01/18/2022   Procedure: COLONOSCOPY WITH PROPOFOL;  Surgeon: Corbin Ade, MD;  Location: [facility];  Service: Endoscopy;  Laterality: N/A;  10:00am   ESOPHAGOGASTRODUODENOSCOPY  2014   [facility]; small hiatal hernia, 2 mm submucosal nodules in the distal esophagus just proximal to LES with biopsy showing normal mucosa, random gastric biopsy with moderate chronic gastritis, H. pylori positive.   POLYPECTOMY  01/18/2022   Procedure: POLYPECTOMY;  Surgeon: Corbin Ade, MD;  Location: [facility];  Service: Endoscopy;;  at anastomosis and descending   THYROID SURGERY      ALLERGIES: Allergies  Allergen Reactions   Pravastatin Other (See Comments)    Muscle pain    FAMILY HISTORY: Family History  Problem Relation Age of Onset   Heart failure Mother    Hypertension Daughter    Colon cancer Neg Hx     SOCIAL HISTORY: Social History   Tobacco Use   Smoking status: Former  Types: Cigarettes    Quit date: 2007    Years since quitting: 17.5   Smokeless tobacco: Never  Vaping Use   Vaping Use: Never used  Substance Use Topics   Alcohol use: No   Drug use: Not Currently   Social History   Social History Narrative   Are you right handed or left handed? right   Are you currently employed ? no   What is your current occupation?retired   Do you live at home alone?   Who lives with you? husband   What type of home do you live in: 1 story or 2 story? one    Caffeine one cup daily      Objective:  Vital Signs:  There were no vitals taken for this visit.  ***  Labs and Imaging review: New results: 08/10/22: IFE: no M protein B1 wnl B12: > 1500  01/22/23: CBC unremarkable BMP: significant for glucose of 292  MRI cervical spine wo contrast (09/04/22): FINDINGS: Alignment: Straightening. No focal angulation or significant listhesis.   Vertebrae: No acute or suspicious osseous findings. Prominent bilateral cervical ribs at C7.   Cord: Normal in signal and caliber.   Posterior Fossa, vertebral arteries, paraspinal tissues: Visualized portions of the posterior fossa appear unremarkable.Bilateral vertebral artery flow voids. No significant  paraspinal findings.   Disc levels:   C2-3: Disc height is maintained. Left greater than right facet hypertrophy with resulting mild left foraminal narrowing. The right foramen and spinal canal are patent.   C3-4: Asymmetric uncinate spurring and facet hypertrophy on the left contribute to moderate to severe left foraminal narrowing and probable left C4 nerve root encroachment. No cord deformity. The right foramen is patent.   C4-5: Spondylosis with posterior osteophytes covering diffusely bulging disc material. The CSF surrounding the cord is effaced without cord deformity. Mild facet hypertrophy bilaterally with mild biforaminal narrowing.   C5-6: Spondylosis with posterior osteophytes covering diffusely bulging disc material. Mild bilateral facet hypertrophy. The CSF surrounding the cord is partially effaced without cord deformity. Moderate to severe right and moderate left foraminal narrowing.   C6-7: Spondylosis with posterior osteophytes covering diffusely bulging disc material. Mild bilateral facet hypertrophy. No cord deformity or significant foraminal narrowing.   C7-T1: Disc bulging and endplate osteophytes asymmetric to the left. Mild facet hypertrophy. Mild left foraminal narrowing without nerve root encroachment. The central canal and right foramen are patent.   IMPRESSION: 1. Multilevel cervical spondylosis as described. There is mild spinal stenosis at C4-5 and C5-6 without cord deformity or abnormal cord signal. 2. Multilevel foraminal narrowing as described, greatest on the left at C3-4 and on the right at C5-6. 3. Multilevel facet hypertrophy, worst on the left at C2-3 and C3-4.  Lumbar spine xray (01/22/23): FINDINGS: There is no evidence of lumbar spine fracture. There is stable, approximately 4 mm anterolisthesis of the L4 vertebral body on L5. Mild to moderate severity multilevel endplate sclerosis is seen throughout the lumbar spine. Marked severity,  bilateral facet joint hypertrophy is seen at the levels of L4-L5 and L5-S1. Intervertebral disc spaces are maintained.   IMPRESSION: 1. Stable, approximately 4 mm anterolisthesis of the L4 vertebral body on L5. 2. Marked severity, bilateral facet joint hypertrophy at the levels of L4-L5 and L5-S1. 3. Mild to moderate severity multilevel degenerative changes.  EMG (09/11/22): NCV & EMG Findings: Extensive electrodiagnostic evaluation of the left upper and lower limb with additional nerve conduction studies of the right upper limb shows: Bilateral median sensory responses  show prolonged distal peak latency (L5.2, R4.1 ms) and reduced amplitude (L8, R8 V). Left sural, superficial peroneal/fibular, ulnar, and radial sensory responses are within normal limits. Bilateral median (APB) motor responses show prolonged distal onset latency (L5.3, R4.1 ms). Left H reflex latency is within normal limits. Chronic motor axon loss changes without accompanying active denervation changes are seen in the left tibialis anterior, flexor digitorum longus, gluteus medius, L5 lumbosacral paraspinal, first dorsal interosseous, and extensor indicis proprius muscles.   Impression: This is an abnormal study. The findings are most consistent with the following: The residuals of an old intraspinal canal lesion (ie motor radiculopathy) at the left L5 root, mild to moderate in degree electrically. The residuals of an old intraspinal canal lesion (ie motor radiculopathy) at the left C8 root, mild in degree electrically. Evidence of bilateral median mononeuropathy at or distal to the wrist, consistent with carpal tunnel syndrome, moderate in degree electrically on the left and mild in degree electrically on the right. No electrodiagnostic evidence of a large fiber sensorimotor neuropathy.  Previously reviewed results: BMP (01/09/22): significant for elevated glucose (137) CK (05/03/20): 136   External  labs: HbA1c: 01/25/22: 7.6 06/26/22: 6.2   Imaging: Lumbar spine xray (10/01/2018): FINDINGS: 5 mm anterolisthesis L4-5 appears degenerative with advanced facet degeneration at L4-5. Remaining alignment normal. Negative for fracture.   No significant disc space narrowing.   IMPRESSION: Degenerative anterolisthesis L4-5 with severe facet degeneration at L4-5.  Assessment/Plan:  This is Saya Oliverio, a 67 y.o. female with: ***   Plan: ***  Return to clinic in ***  Total time spent reviewing records, interview, history/exam, documentation, and coordination of care on day of encounter:  *** min  Jacquelyne Balint, MD

## 2023-02-08 ENCOUNTER — Ambulatory Visit: Payer: No Typology Code available for payment source | Admitting: Neurology

## 2023-02-14 NOTE — Progress Notes (Deleted)
NEUROLOGY FOLLOW UP OFFICE NOTE  Tammy Thompson [id number]  Subjective:  Tammy Thompson is a 67 y.o. year old right-handed female with a medical history of DM2, HTN, HLD, hypothyroidism, low back pain, OSA, PTSD, depression, and anxiety who we last saw on 08/10/22.  To briefly review: Patient has had burning limited to bilateral feet for about 1 year. She has tried gabapentin and capsaicin cream. Neither of these helped. Nervive over the counter supplement has helped some with the burning in the feet. (Ingredients: thiamine 1.2 mg, B6 1.7 mg, B12 2.4 mcg, calcium 27 mg, alpha lipoic acid 600 mg, "herbal blend" 30 mg - tumeric and ginger). She sometimes feels imbalance when walking. She denies falls.   She mentions her big toe nails are splints.   When patient lays on her left side, her entire left side will go numb (arm and leg). This will rarely happen on the right as well. It does not occur if she is not laying on her side. This has been on about 7-8 months.    She denies significant neck or back pain. She denies changes to bowel or bladder.   She does not report any constitutional symptoms like fever, night sweats, anorexia or unintentional weight loss.   EtOH use: No  Restrictive diet? No Family history of neuropathy/myopathy/neurologic disease? No  Most recent Assessment and Plan (08/10/22): Tammy Thompson is a 67 y.o. female who presents for evaluation of burning in feet and numbness on left arm and leg. She has a relevant medical history of DM2, HTN, HLD, hypothyroidism, low back pain, OSA, PTSD, depression, and anxiety. Her neurological examination is pertinent for asymmetric reflexes (L > R) and reduced sensation in feet (left > right). Available diagnostic data is significant for HbA1c of 6.2 (previously 7.6 earlier this year) and lumbar spine xray showed severe facet degeneration at L4-5. Patient's burning pain in feet is likely secondary to neuropathy, likely secondary to diabetes. I  will send labs for other potential treatable causes. She has significant asymmetry between the left and right. I suspect she may have cervical and/or lumbar stenosis. Cervical stenosis could explain the left arm and leg symptoms. I will get an MRI to evaluate this. I will get an EMG to evaluate for neuropathy and possible lumbar radiculopathy.   PLAN: -Blood work: B1, B12, IFE -MRI cervical spine wo contrast -EMG: PN protocol (L > R) -Can continue nervive, but alpha lipoic acid 600 mg once or twice daily would likely be as good -Start Cymbalta 30 mg for 1 week, then 60 mg thereafter -Lidocaine cream PRN   Since their last visit: Patient had some diarrhea with Cymbalta 30 mg so did not increase to 60 mg.***  Labs were normal. MRI cervical spine on 09/04/22 showed multilevel cervical spondylosis. 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 Additionally, there was evidence of bilateral carpal tunnel syndrome. I recommended bilateral wrist splints. ****  She went to PT. Per my phone note from 12/04/22: Patient was going to physical therapy and felt pain in her neck after some exercises. The muscles of her neck feel tight. It got so bad that she went to the ED. She denies any radiation of pain down into her arms or new arm weakness or numbness. She is not sure about taking the opioids that were prescribed. I recommended that she could alternate tylenol and ibuprofen or try heat and ice if  she would prefer over the counter medications for what sounds like a neck strain. She will be seeing her PCP tomorrow.   ***  MEDICATIONS:  Outpatient Encounter Medications as of 02/28/2023  Medication Sig Note   acyclovir (ZOVIRAX) 200 MG capsule Take 200 mg by mouth in the morning.    aspirin EC 81 MG tablet Take 81 mg by mouth 2 (two) times a week.    DULoxetine (CYMBALTA) 30 MG capsule Take 2 capsules (60 mg total) by mouth  daily. Take 1 capsule (30 mg) for one week. If no side effects, can increase to 2 capsules (60 mg) thereafter.    empagliflozin (JARDIANCE) 25 MG TABS tablet Take 25 mg by mouth in the morning.    ezetimibe (ZETIA) 10 MG tablet Take 10 mg by mouth in the morning.    glimepiride (AMARYL) 4 MG tablet Take 4 mg by mouth in the morning.    hydrochlorothiazide (HYDRODIURIL) 25 MG tablet Take 12.5 mg by mouth in the morning.    HYDROcodone-acetaminophen (NORCO/VICODIN) 5-325 MG tablet Take one tab po q 4 hrs prn pain    ibuprofen (ADVIL) 800 MG tablet Take 1 tablet (800 mg total) by mouth every 8 (eight) hours as needed for moderate pain.    levothyroxine (SYNTHROID) 125 MCG tablet Take 125 mcg by mouth daily before breakfast.    lisinopril (PRINIVIL,ZESTRIL) 40 MG tablet Take 40 mg by mouth in the morning.    metFORMIN (GLUCOPHAGE) 1000 MG tablet Take 1,000 mg by mouth in the morning and at bedtime. 01/18/2022: Took 1/2 dose    Multiple Vitamin (MULTIVITAMIN) tablet Take 1 tablet by mouth 2 (two) times a week.    oxyCODONE-acetaminophen (PERCOCET) 5-325 MG tablet Take 2 tablets by mouth every 6 (six) hours as needed.    VITAMIN D PO Take 5,000 Units by mouth in the morning.    No facility-administered encounter medications on file as of 02/28/2023.    PAST MEDICAL HISTORY: Past Medical History:  Diagnosis Date   Cancer (HCC) 2008   colon   Diabetes mellitus    Hypertension    Sleep apnea    Thyroid disease     PAST SURGICAL HISTORY: Past Surgical History:  Procedure Laterality Date   ABDOMINAL HYSTERECTOMY     Partial*   BLADDER SURGERY     COLON SURGERY     right hemicolectomy   COLONOSCOPY  2017   At [facility]; 1 tubular adenoma, repeat in 5 years   COLONOSCOPY WITH PROPOFOL N/A 01/18/2022   Procedure: COLONOSCOPY WITH PROPOFOL;  Surgeon: Corbin Ade, MD;  Location: [facility];  Service: Endoscopy;  Laterality: N/A;  10:00am   ESOPHAGOGASTRODUODENOSCOPY  2014   [facility]; small hiatal hernia, 2 mm submucosal nodules in the distal esophagus just proximal to LES with biopsy showing normal mucosa, random gastric biopsy with moderate chronic gastritis, H. pylori positive.   POLYPECTOMY  01/18/2022   Procedure: POLYPECTOMY;  Surgeon: Corbin Ade, MD;  Location: [facility];  Service: Endoscopy;;  at anastomosis and descending   THYROID SURGERY      ALLERGIES: Allergies  Allergen Reactions   Pravastatin Other (See Comments)    Muscle pain    FAMILY HISTORY: Family History  Problem Relation Age of Onset   Heart failure Mother    Hypertension Daughter    Colon cancer Neg Hx     SOCIAL HISTORY: Social History   Tobacco Use   Smoking status: Former  Current packs/day: 0.00    Types: Cigarettes    Quit date: 2007    Years since quitting: 17.5   Smokeless tobacco: Never  Vaping Use   Vaping status: Never Used  Substance Use Topics   Alcohol use: No   Drug use: Not Currently   Social History   Social History Narrative   Are you right handed or left handed? right   Are you currently employed ? no   What is your current occupation?retired   Do you live at home alone?   Who lives with you? husband   What type of home do you live in: 1 story or 2 story? one    Caffeine one cup daily      Objective:  Vital Signs:  There were no vitals taken for this visit.  ***  Labs and Imaging review: New results: 08/10/22: IFE: no M protein B1 wnl B12: > 1500  01/22/23: CBC unremarkable BMP: significant for glucose of 292  MRI cervical spine wo contrast (09/04/22): FINDINGS: Alignment: Straightening. No focal angulation or significant listhesis.   Vertebrae: No acute or suspicious osseous findings. Prominent bilateral cervical ribs at C7.   Cord: Normal in signal and caliber.   Posterior Fossa, vertebral arteries, paraspinal tissues: Visualized portions of the posterior fossa appear unremarkable.Bilateral vertebral  artery flow voids. No significant paraspinal findings.   Disc levels:   C2-3: Disc height is maintained. Left greater than right facet hypertrophy with resulting mild left foraminal narrowing. The right foramen and spinal canal are patent.   C3-4: Asymmetric uncinate spurring and facet hypertrophy on the left contribute to moderate to severe left foraminal narrowing and probable left C4 nerve root encroachment. No cord deformity. The right foramen is patent.   C4-5: Spondylosis with posterior osteophytes covering diffusely bulging disc material. The CSF surrounding the cord is effaced without cord deformity. Mild facet hypertrophy bilaterally with mild biforaminal narrowing.   C5-6: Spondylosis with posterior osteophytes covering diffusely bulging disc material. Mild bilateral facet hypertrophy. The CSF surrounding the cord is partially effaced without cord deformity. Moderate to severe right and moderate left foraminal narrowing.   C6-7: Spondylosis with posterior osteophytes covering diffusely bulging disc material. Mild bilateral facet hypertrophy. No cord deformity or significant foraminal narrowing.   C7-T1: Disc bulging and endplate osteophytes asymmetric to the left. Mild facet hypertrophy. Mild left foraminal narrowing without nerve root encroachment. The central canal and right foramen are patent.   IMPRESSION: 1. Multilevel cervical spondylosis as described. There is mild spinal stenosis at C4-5 and C5-6 without cord deformity or abnormal cord signal. 2. Multilevel foraminal narrowing as described, greatest on the left at C3-4 and on the right at C5-6. 3. Multilevel facet hypertrophy, worst on the left at C2-3 and C3-4.  Lumbar spine xray (01/22/23): FINDINGS: There is no evidence of lumbar spine fracture. There is stable, approximately 4 mm anterolisthesis of the L4 vertebral body on L5. Mild to moderate severity multilevel endplate sclerosis is seen throughout  the lumbar spine. Marked severity, bilateral facet joint hypertrophy is seen at the levels of L4-L5 and L5-S1. Intervertebral disc spaces are maintained.   IMPRESSION: 1. Stable, approximately 4 mm anterolisthesis of the L4 vertebral body on L5. 2. Marked severity, bilateral facet joint hypertrophy at the levels of L4-L5 and L5-S1. 3. Mild to moderate severity multilevel degenerative changes.  EMG (09/11/22): NCV & EMG Findings: Extensive electrodiagnostic evaluation of the left upper and lower limb with additional nerve conduction studies of the right upper  limb shows: Bilateral median sensory responses show prolonged distal peak latency (L5.2, R4.1 ms) and reduced amplitude (L8, R8 V). Left sural, superficial peroneal/fibular, ulnar, and radial sensory responses are within normal limits. Bilateral median (APB) motor responses show prolonged distal onset latency (L5.3, R4.1 ms). Left H reflex latency is within normal limits. Chronic motor axon loss changes without accompanying active denervation changes are seen in the left tibialis anterior, flexor digitorum longus, gluteus medius, L5 lumbosacral paraspinal, first dorsal interosseous, and extensor indicis proprius muscles.   Impression: This is an abnormal study. The findings are most consistent with the following: The residuals of an old intraspinal canal lesion (ie motor radiculopathy) at the left L5 root, mild to moderate in degree electrically. The residuals of an old intraspinal canal lesion (ie motor radiculopathy) at the left C8 root, mild in degree electrically. Evidence of bilateral median mononeuropathy at or distal to the wrist, consistent with carpal tunnel syndrome, moderate in degree electrically on the left and mild in degree electrically on the right. No electrodiagnostic evidence of a large fiber sensorimotor neuropathy.  Previously reviewed results: BMP (01/09/22): significant for elevated glucose (137) CK (05/03/20):  136   External labs: HbA1c: 01/25/22: 7.6 06/26/22: 6.2   Imaging: Lumbar spine xray (10/01/2018): FINDINGS: 5 mm anterolisthesis L4-5 appears degenerative with advanced facet degeneration at L4-5. Remaining alignment normal. Negative for fracture.   No significant disc space narrowing.   IMPRESSION: Degenerative anterolisthesis L4-5 with severe facet degeneration at L4-5.  Assessment/Plan:  This is Vania Rosero, a 67 y.o. female with: ***   Plan: ***  Return to clinic in ***  Total time spent reviewing records, interview, history/exam, documentation, and coordination of care on day of encounter:  *** min  Jacquelyne Balint, MD

## 2023-02-28 ENCOUNTER — Ambulatory Visit: Payer: No Typology Code available for payment source | Admitting: Neurology

## 2023-02-28 ENCOUNTER — Encounter: Payer: Self-pay | Admitting: Neurology

## 2023-02-28 DIAGNOSIS — Z029 Encounter for administrative examinations, unspecified: Secondary | ICD-10-CM
